# Patient Record
Sex: Male | Born: 1975 | Race: White | Hispanic: No | Marital: Single | Smoking: Current every day smoker
Health system: Southern US, Community
[De-identification: ages and names within clinical notes are randomized; demographics above are authoritative.]

## PROBLEM LIST (undated history)

## (undated) DIAGNOSIS — E7800 Pure hypercholesterolemia, unspecified: Secondary | ICD-10-CM

## (undated) DIAGNOSIS — M1990 Unspecified osteoarthritis, unspecified site: Secondary | ICD-10-CM

## (undated) DIAGNOSIS — I10 Essential (primary) hypertension: Secondary | ICD-10-CM

## (undated) HISTORY — PX: CARPAL TUNNEL RELEASE: SHX101

## (undated) HISTORY — PX: TESTICLE SURGERY: SHX794

## (undated) HISTORY — PX: EYE SURGERY: SHX253

## (undated) HISTORY — PX: CHOLECYSTECTOMY: SHX55

---

## 1999-05-21 ENCOUNTER — Encounter: Payer: Self-pay | Admitting: Family Medicine

## 1999-05-21 ENCOUNTER — Ambulatory Visit (HOSPITAL_COMMUNITY): Admission: RE | Admit: 1999-05-21 | Discharge: 1999-05-21 | Payer: Self-pay | Admitting: Family Medicine

## 1999-07-04 ENCOUNTER — Emergency Department (HOSPITAL_COMMUNITY): Admission: EM | Admit: 1999-07-04 | Discharge: 1999-07-04 | Payer: Self-pay | Admitting: Emergency Medicine

## 2002-03-21 ENCOUNTER — Emergency Department (HOSPITAL_COMMUNITY): Admission: EM | Admit: 2002-03-21 | Discharge: 2002-03-21 | Payer: Self-pay | Admitting: Emergency Medicine

## 2002-03-25 ENCOUNTER — Emergency Department (HOSPITAL_COMMUNITY): Admission: EM | Admit: 2002-03-25 | Discharge: 2002-03-25 | Payer: Self-pay | Admitting: Emergency Medicine

## 2002-11-06 ENCOUNTER — Encounter: Payer: Self-pay | Admitting: Internal Medicine

## 2002-11-06 ENCOUNTER — Encounter: Admission: RE | Admit: 2002-11-06 | Discharge: 2002-11-06 | Payer: Self-pay | Admitting: Internal Medicine

## 2006-02-10 ENCOUNTER — Ambulatory Visit (HOSPITAL_BASED_OUTPATIENT_CLINIC_OR_DEPARTMENT_OTHER): Admission: RE | Admit: 2006-02-10 | Discharge: 2006-02-10 | Payer: Self-pay | Admitting: Orthopedic Surgery

## 2006-03-08 ENCOUNTER — Ambulatory Visit (HOSPITAL_BASED_OUTPATIENT_CLINIC_OR_DEPARTMENT_OTHER): Admission: RE | Admit: 2006-03-08 | Discharge: 2006-03-08 | Payer: Self-pay | Admitting: Orthopedic Surgery

## 2006-08-19 ENCOUNTER — Emergency Department (HOSPITAL_COMMUNITY): Admission: EM | Admit: 2006-08-19 | Discharge: 2006-08-19 | Payer: Self-pay | Admitting: Emergency Medicine

## 2006-11-04 ENCOUNTER — Ambulatory Visit (HOSPITAL_COMMUNITY): Admission: RE | Admit: 2006-11-04 | Discharge: 2006-11-04 | Payer: Self-pay | Admitting: Internal Medicine

## 2006-12-01 ENCOUNTER — Encounter (INDEPENDENT_AMBULATORY_CARE_PROVIDER_SITE_OTHER): Payer: Self-pay | Admitting: Surgery

## 2006-12-01 ENCOUNTER — Ambulatory Visit (HOSPITAL_COMMUNITY): Admission: RE | Admit: 2006-12-01 | Discharge: 2006-12-01 | Payer: Self-pay | Admitting: Surgery

## 2007-04-04 ENCOUNTER — Emergency Department (HOSPITAL_COMMUNITY): Admission: EM | Admit: 2007-04-04 | Discharge: 2007-04-04 | Payer: Self-pay | Admitting: Emergency Medicine

## 2007-10-02 ENCOUNTER — Emergency Department (HOSPITAL_COMMUNITY): Admission: EM | Admit: 2007-10-02 | Discharge: 2007-10-02 | Payer: Self-pay | Admitting: Emergency Medicine

## 2008-12-24 IMAGING — US US ABDOMEN COMPLETE
1 series · 14 of 25 positions shown · non-contrast
Comparison: none

CLINICAL DATA: Right sided abdominal pain.  
 ABDOMEN ULTRASOUND:
TECHNIQUE: Complete abdominal ultrasound examination was performed including evaluation of the liver, gallbladder, bile ducts, pancreas, kidneys, spleen, IVC, and abdominal aorta.

[Series 1: us abdomen complete · 14 of 77 slices shown]
[im 1/77]
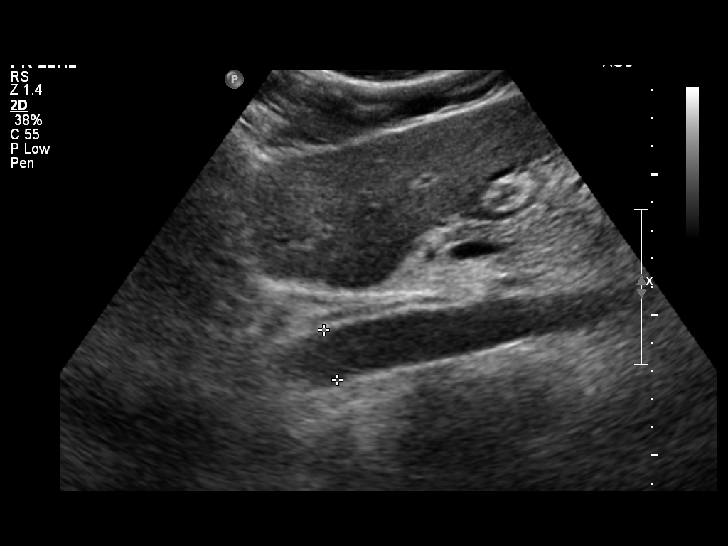
[im 7/77]
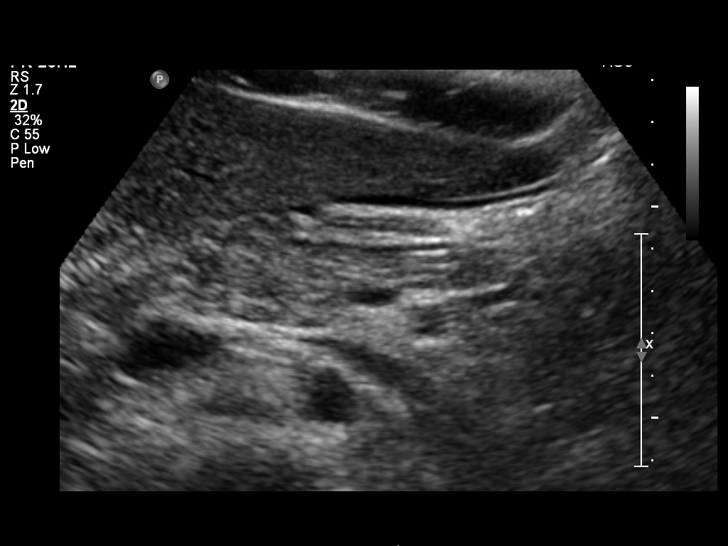
[im 13/77]
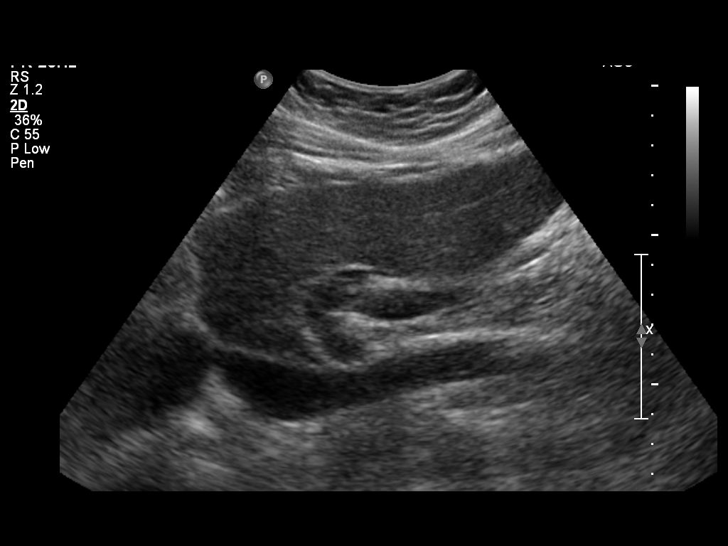
[im 20/77]
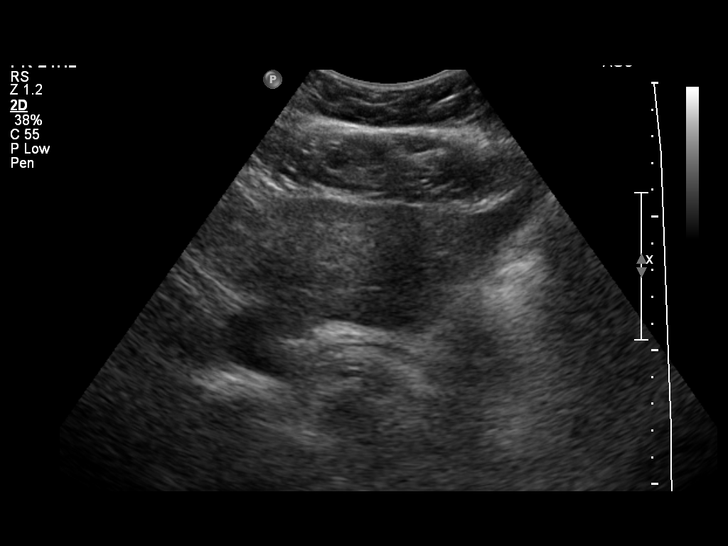
[im 26/77]
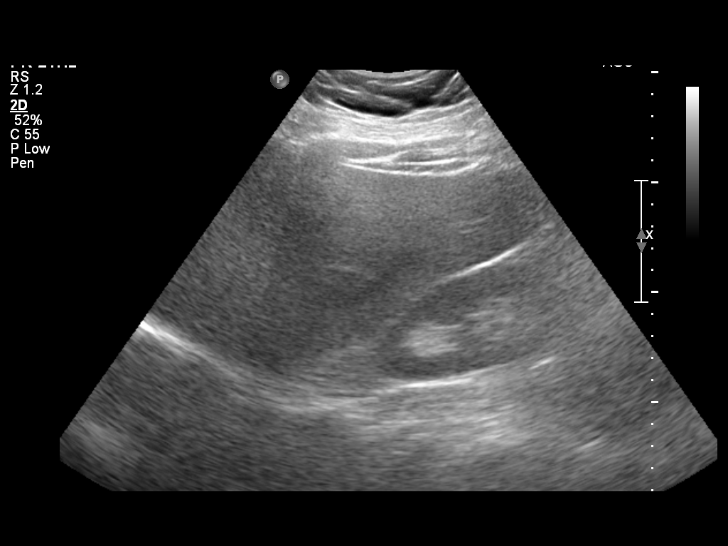
[im 29/77]
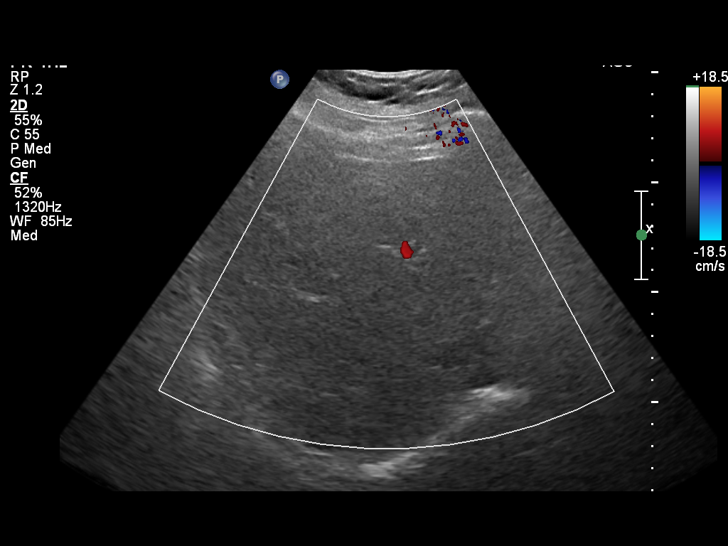
[im 35/77]
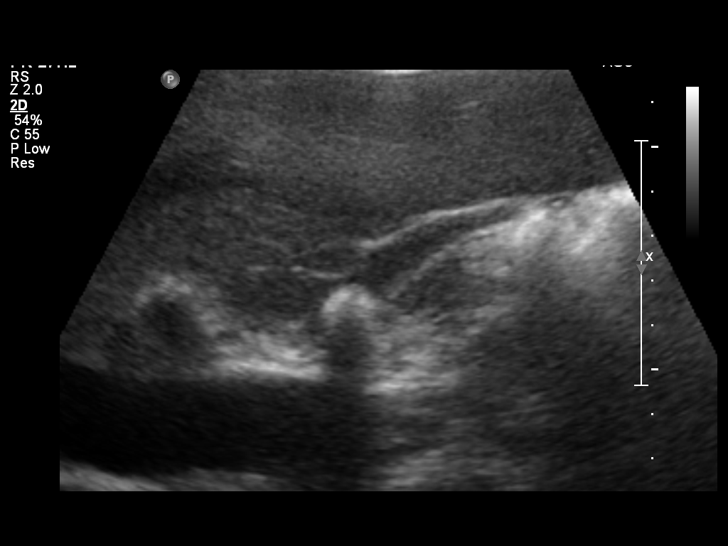
[im 42/77]
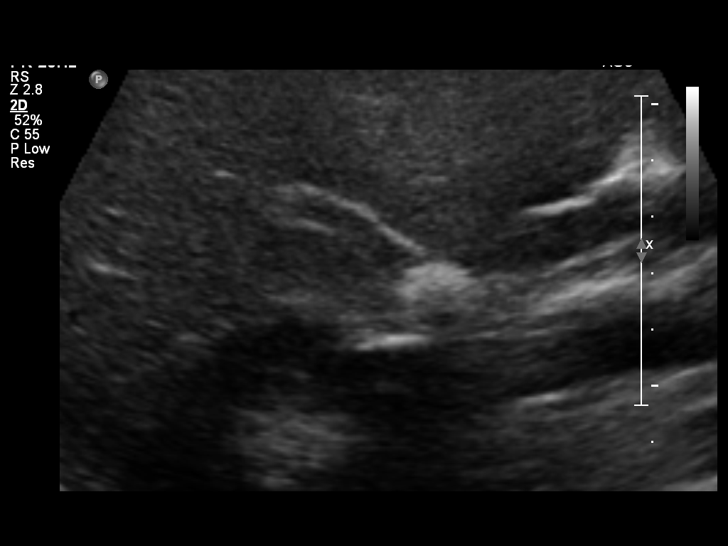
[im 48/77]
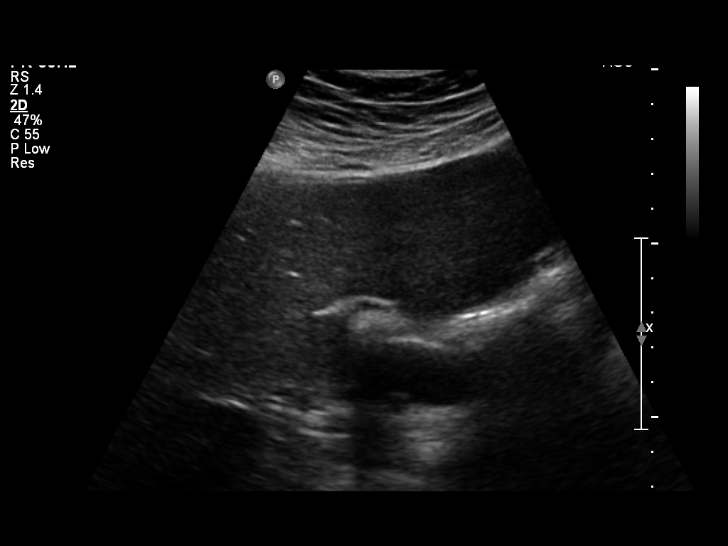
[im 51/77]
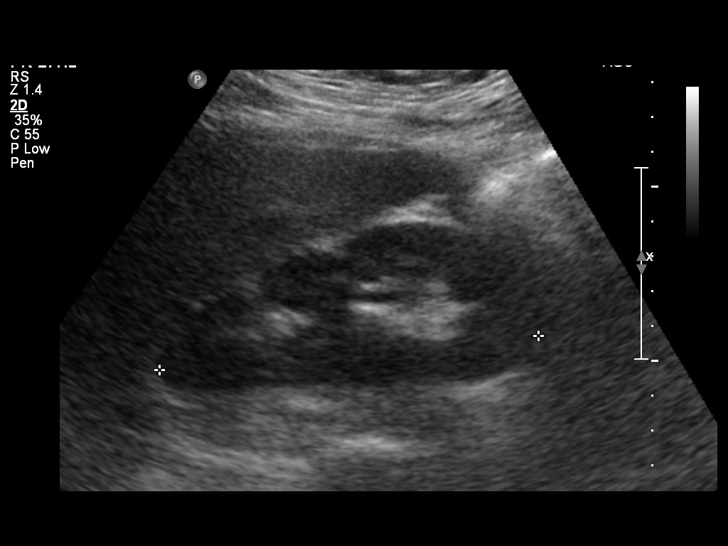
[im 58/77]
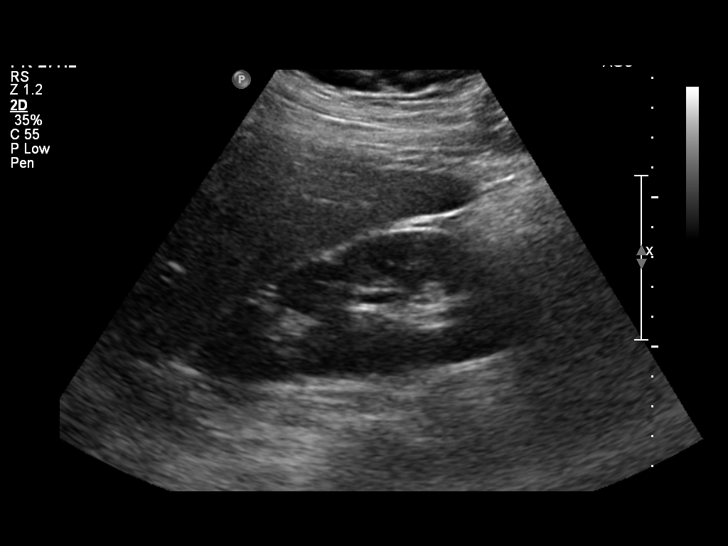
[im 64/77]
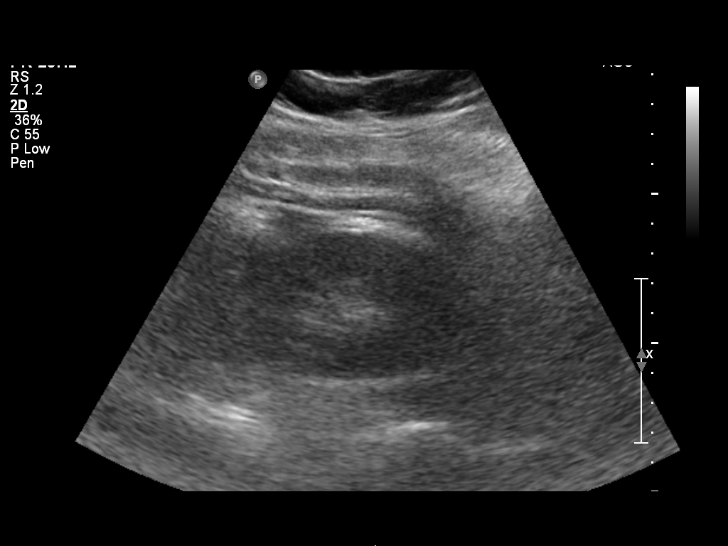
[im 70/77]
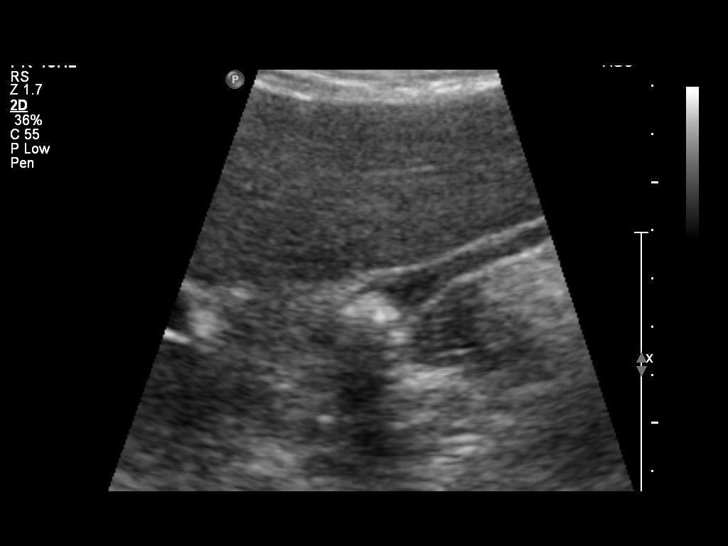
[im 77/77]
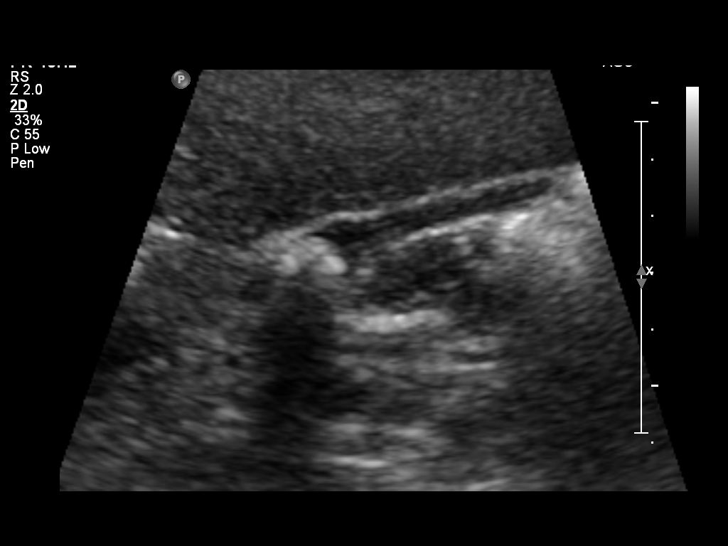

[14 of 25 positions shown; findings below may reference images not displayed]

FINDINGS: A 2 cm nonmobile gallstone is seen within the gallbladder neck.  The gallbladder is incompletely distended which somewhat limits evaluation, but no other gallstones are seen.  There is no evidence of gallbladder dilatation or wall thickening.  There is no evidence of biliary ductal dilatation with common bile duct measuring 2 mm.  Liver is within normal limits and echogenicity and no focal liver lesions are seen. 
 Visualized portions of the IVC and pancreas are unremarkable.  There is no evidence of splenomegaly.  Both kidneys are normal in size and appearance.  There is no evidence of hydronephrosis.  Abdominal aorta is nondilated.
IMPRESSION: 1.  2 cm gallstone within gallbladder neck.  No other sonographic signs of acute cholecystitis.  
 2.  No evidence of biliary ductal dilatation.

## 2010-11-03 NOTE — Op Note (Signed)
NAME:  [name], [name] NO.:  [id number]   MEDICAL RECORD NO.:  [id number]          PATIENT TYPE:  AMB   LOCATION:  DAY                          FACILITY:  [facility]   PHYSICIAN:  Ardeth Sportsman, MD     DATE OF BIRTH:  10-10-1975   DATE OF PROCEDURE:  12/01/2006  DATE OF DISCHARGE:                               OPERATIVE REPORT   PRIMARY CARE PHYSICIAN:  Lucky Cowboy, M.D.   SURGEON:  Ardeth Sportsman, MD   ASSISTANTS:  None.   PREOPERATIVE DIAGNOSES:  1. Cholecystolithiasis.  2. Probable chronic cholecystitis.   POSTOPERATIVE DIAGNOSES:  1. Cholecystolithiasis.  2. Probable chronic cholecystitis.  3. Fatty steatohepatosis of the liver.  4. Right posterolateral diaphragmatic eventration with liver; no      evidence of any significant herniation.   PROCEDURE PERFORMED:  1. Diagnostic laparoscopy.  2. Laparoscopic cholecystectomy  3. Laparoscopic intraoperative cholangiogram.   ANESTHESIA:  1. General anesthesia.  2. Local anesthetic in a field block port sites.   SPECIMEN:  Gallbladder.   DRAINS:  None.  Hyperbilirubinemia  Less than 5 mL.   COMPLICATIONS:  None apparent.   INDICATIONS:  Mr. Ion is a pleasant 35 year old male with a classic  story for biliary colic, as well as possible reflux-like symptoms.  He  has undergone extensive workup by his primary care physician, Dr.  Oneta Rack, and had evidence of a large gallstone in the gallbladder neck.  He has had some improvement of his symptoms on proton pump inhibitors,  but he has not completely abated; and he still has persistent biliary  colic symptoms.   The anatomy and physiology of hepatobiliary and pancreatic function as  well as the antireflux mechanism and foregut physiology was discussed in  detail.  Pathophysiology of cholecystolithiasis with its risk of  gallstone pancreatitis, cholecystitis with severe dehydration,  choledocholithiasis, and other natural history risks were  discussed.  Options were discussed and recommendations made for laparoscopic  cholecystectomy with intraoperative cholangiogram.   Risks such as stroke, heart attack, deep venous thrombosis, pulmonary  embolism, and death were discussed.  Risks such as bleeding, need for  transfusion, wound infection, abscess, injury to other organs,  incisional hernia, prolonged pain and persistent reflux and other risks  were discussed.  The risks of bile leak including the need for operative  reconstruction or bile duct exploration; internal-or-external drainage  with stenting and/or drains were discussed as well.  Other risks were  discussed.  Questions answered, he agreed to proceed.   OPERATIVE FINDINGS:  He had a shrunken gallbladder with an obvious large  stone within it and a somewhat thickened wall.  There were no major  adhesions.  His liver was very fatty, but I do not think that he had  raging hepatitis or evidence of any cirrhosis.  He had a posterolateral  diaphragmatic eventration which had his right hepatic liver going up  into it with no other evidence of any incarceration.  It seemed to be  probably about, I would say, about 10 x 10 cm in size.   DESCRIPTION OF PROCEDURE:  Informed  consent was confirmed.  The patient  received IV antibiotics.  He urinated prior to going to the operating  room.  He had sequential compression devices active during the entire  case.  He underwent general anesthesia without difficulty.  His abdomen  was scrubbed, prepped, and draped in a sterile fashion.   Entry was gained into the abdomen with the patient in steep reverse  Trendelenburg and right side up using optical entry using a 5-mm/0-  degree scope.  Camera inspection revealed a small nick on the liver, but  no evidence of any significant laceration.  There was no other evidence  of injury.  Under direct visualization, a 5-mm port was placed through  the umbilicus; and another one in the right  flank.  A 10-mm port was  tunneled through the falciform ligament; in the right subxiphoid region;  just right of midline.   The gallbladder fundus was grasped and elevated cephalad.  Peritoneal  coverage between the gallbladder and liver bed were freed off in the  anteroposterior and posterolateral aspects.  During this, there was a  nick in the gallbladder; and there was some leak of uninfected bile.  This was aspirated and caught well to minimize contamination.  There was  no spillage of stones.  His liver falciform ligament was very long and  blocking in the way, since it seemed to be far right of midline.  I  ultimately ended up ligating and transecting his falciform ligament,  using a focused high-dose cautery; and reinspection showed good  hemostasis at the end of the case.   Circumferential dissection was done such that the proximal half of the  gallbladder was freed off the liver bed.  This revealed three structures  going from the gallbladder down to the porta hepatis.  Two were small  branches of the anterior and posterior branches of the cystic artery  since they came down in a Y to go down to the porta hepatis.  One clip  high on the  gallbladder side, and two clips slightly proximally were  done; and these two structures were transected.  This left one structure  coming, tapering off the infundibulum, down to the porta hepatis  consistent with the cystic duct.  Two clips on the infundibulum were  placed and a partial cyst dichotomy was performed.  I got immediate  return of clear bile.   A 5-French cholangiocatheter was placed through a right subcostal stab  incision, flush, and placed in the cystic duct.  Cholangiogram was run  using a diluted radiopaque opaque contrast using continuous fluoroscopy.  Cholangiogram was normal in that there was cannulation of a side  helicobranch into a common bile duct.  Contrast flowed well into the  right and left intrahepatic chains.   It flowed down over a nice smooth  tapering across the ampule into a normal appearing duodenum.  Cholangiocath was removed.  Four clips were placed on the cystic duct  slightly proximal to the partial transection and cystic duct transection  was completed.   Gallbladder was were freed from its remaining attachments on the liver  bed and removed in an EndoCatch bag out the subxiphoid port to a good  result.  Camera inspection was done to have excellent hemostasis on the  liver bed; at the site of the former falciform ligament to make sure  that there is no evidence of any injury or laceration.  It was  excellent.  Clips were intact on the cystic arterial  branches; and the  cystic duct stumps.  Copious irrigation of 2 liters were done with a  nice clear return with no evidence of any significant bile or blood  contamination.  The fascial defect of the subxiphoid port was  reapproximated using a #0 Vicryl stitch using laparoscopic #0 passer.  The upper three abdominal ports were removed; and there was no evidence  of any bleeding or peritoneum or hematoma.  Peritoneum was evacuated and  all ports were removed.  Fascial stitch was tied down.  Skin was closed  using a 4-0 Monocryl stitch and sterile dressing was applied.  The  patient was extubated and sent to the recovery room in stable condition.   I explained the operative findings to the patient's family, questions  answered, and they expressed understanding and appreciation.      Ardeth Sportsman, MD  Electronically Signed     SCG/MEDQ  D:  12/01/2006  T:  12/01/2006  Job:  [id number]   cc:   Lucky Cowboy, M.D.  Fax: [phone]

## 2010-11-06 NOTE — Op Note (Signed)
NAME:  [name], [name] NO.:  [id number]   MEDICAL RECORD NO.:  [id number]          PATIENT TYPE:  AMB   LOCATION:  [facility]                         FACILITY:  [facility]   PHYSICIAN:  Deidre Ala, M.D.    DATE OF BIRTH:  1976-02-05   DATE OF PROCEDURE:  02/10/2006  DATE OF DISCHARGE:                                 OPERATIVE REPORT   PREOPERATIVE DIAGNOSIS:  Severe right carpal tunnel syndrome.   POSTOPERATIVE DIAGNOSIS:  Severe right carpal tunnel syndrome.   PROCEDURE:  Right carpal tunnel release.   SURGEON:  1. Charlesetta Shanks, M.D.   ASSISTANT:  Clarene Reamer, P.A.-C.   ANESTHESIA:  IV regional.   CULTURES:  None.   DRAINS:  None.   ESTIMATED BLOOD LOSS:  Minimal.   TOURNIQUET TIME:  30 minutes.   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  He had signs and symptoms of  carpal tunnel syndrome.  He works for [employer], working at the [employer].  He underwent nerve conduction studies which showed severe  right side median mononeuropathy and moderate left. We did do injections  with splints, but due to persistence of his symptoms and increasing  discomfort he elected to proceed with release.  At surgery, he had a very  tense carpal canal, with the nerve being pressed up against the transverse  carpal ligament.  The nerve was somewhat enlarged over the normal size and  bifurcated at the level of the transverse carpal ligament with hourglassing.  There was tenosynovitis of the wrist, but there were no overt palpable  masses.  Good release was obtained well up to the distal forearm, with a  relatively small palmar incision.  All branches were placed distally,  including the motor branch.   PROCEDURE:  With adequate anesthesia obtained using IV regional technique, 1  g Ancef given for IV prophylaxis, the patient was placed in the supine  position.  The right upper  extremity was prepped from fingertips to the  upper forearm in the standard fashion.  After standard prepping and draping,  a curvilinear incision was made at the base of the palm in line with the  thenar flexion crease to the distal wrist flexion crease.  Incision was  deepened sharply with a knife, and hemostasis obtained using the bipolar  coagulator.  Loupe magnification was used as dissection was carried down the  palmar fascia.  A Freer elevator was then placed underneath the transverse  carpal ligament to protect the nerve, and we cut down upon it with a 64  Beaver blade, exposing the nerve.  There was some perineural fat that was  also expanding in space.  I then meticulously dissected with scissors and  neurolysed the nerve, all branches distal, including the motor branch and  proximal, and released the distal wrist retinaculum on the ulnar side of the  nerve up the distal forearm underneath the skin, with a Sewell retractor  used for visualization.  I then carefully dissected the  volar component of  the epineurium to open up the hourglassing of the nerve.  I then checked the  underlying carpal tunnel and palpated with the small finger up the canal to  make sure there were no overt masses.  Irrigation was carried out. Bleeding  points were cauterized.  The wound was then closed using running and  interrupted 4-0 nylon on the skin.  I then had placed due to some increased  pain during surgery after IV regional, about 10 cc 0.5% Marcaine plain in  and about the wound.  A bulky sterile compressive dressings was applied with  volar splint, plaster splint in slight cock-up. The patient then, having  tolerated the procedure well, was awakened and taken to the recovery room in  satisfactory condition, to be discharged per outpatient routine, given  Percocet for pain, elevation, and told call the office for recheck on  Monday.           ______________________________  V. Charlesetta Shanks,  M.D.     VEP/MEDQ  D:  02/10/2006  T:  02/10/2006  Job:  [id number]

## 2010-11-06 NOTE — Op Note (Signed)
NAME:  WHEELER, INCORVAIA NO.:  [id number]   MEDICAL RECORD NO.:  [id number]          PATIENT TYPE:  AMB   LOCATION:  [facility]                         FACILITY:  [facility]   PHYSICIAN:  Deidre Ala, M.D.    DATE OF BIRTH:  October 15, 1975   DATE OF PROCEDURE:  03/08/2006  DATE OF DISCHARGE:                                 OPERATIVE REPORT   PREOPERATIVE DIAGNOSIS:  Left carpal tunnel syndrome, moderate to severe.   POSTOPERATIVE DIAGNOSIS:  Left carpal tunnel syndrome, moderate to severe.   PROCEDURE:  Left carpal tunnel release.   SURGEON:  1. Charlesetta Shanks, M.D.   ASSISTANT:  Clarene Reamer, P.A.-C.   ANESTHESIA:  IV regional.   CULTURES:  None.   DRAINS:  None.   BLOOD LOSS:  Minimal.   TOURNIQUET TIME:  30 minutes.   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  He works as a Radiographer, therapeutic, working  at [employer].  He underwent nerve conduction studies which  showed severe right side median mononeuropathy and moderate left.  We did do  injections with splints, but due to persistence of his symptoms, we went  ahead with a right carpal tunnel release on February 10, 2006 at this  facility.  He had excellent results in the short-term and is doing well and  decided to proceed with the other side.  At surgery, he had a very tight  nerve under tight transverse carpal ligament that released well.  All  branches were traced distally including the motor branch.   PROCEDURE:  With adequate anesthesia obtained using IV regional technique, 1  gram of Ancef given IV prophylaxis.  The patient was placed in the supine  position.  The left upper extremity was prepped from the fingers to the  upper forearm in the standard fashion.  After standard prepping and draping,  an incision was then made in the Langer skin lines in the palmar base  longitudinally to the distal palmar flexion crease.   Incision was deepened  sharply, and adequate hemostasis was obtained using the Bovie  electrocoagulator.  Under loop magnification, dissection was carried down to  the transverse carpal ligament as well as palmar fascia.  A Freer elevator  was placed underneath it to protect the nerve, and then I cut down upon with  a 64 Beaver blade to release it.  The nerve was then neurolysed with  scissors and released on the ulnar border of the distal wrist flexor  retinaculum with a Sewell retractor.  All branches were traced distally  including the motor branch.  We did a volar aponeurotomy.  When I was  satisfied with release and having removed part of the ulnar side of the  transverse carpal ligament, irrigation was carried out.  The wound was then  closed with running and interrupted 4-0 nylon.  A bulky sterile compressive  dressing was applied with volar plaster splint in slight cock-up.  The  patient having tolerated procedure well was taken to the  recovery room in  satisfactory condition to be discharged per outpatient routine, given  Vicodin for pain and told to call the office for an appointment for recheck  on Friday.           ______________________________  V. Charlesetta Shanks, M.D.     VEP/MEDQ  D:  03/08/2006  T:  03/09/2006  Job:  [id number]

## 2011-04-08 LAB — BASIC METABOLIC PANEL
BUN: 14
CO2: 28
Chloride: 105
GFR calc non Af Amer: >60
Glucose, Bld: 101 — ABNORMAL HIGH
Potassium: 4
Sodium: 142

## 2016-08-30 ENCOUNTER — Encounter (HOSPITAL_COMMUNITY): Payer: Self-pay | Admitting: Emergency Medicine

## 2016-08-30 ENCOUNTER — Emergency Department (HOSPITAL_COMMUNITY): Payer: Self-pay

## 2016-08-30 ENCOUNTER — Emergency Department (HOSPITAL_COMMUNITY)
Admission: EM | Admit: 2016-08-30 | Discharge: 2016-08-30 | Disposition: A | Discharge status: Home / Self Care | Payer: Self-pay | Attending: Emergency Medicine | Admitting: Emergency Medicine

## 2016-08-30 DIAGNOSIS — F17200 Nicotine dependence, unspecified, uncomplicated: Secondary | ICD-10-CM | POA: Insufficient documentation

## 2016-08-30 DIAGNOSIS — J029 Acute pharyngitis, unspecified: Secondary | ICD-10-CM | POA: Insufficient documentation

## 2016-08-30 DIAGNOSIS — J069 Acute upper respiratory infection, unspecified: Secondary | ICD-10-CM

## 2016-08-30 DIAGNOSIS — R059 Cough, unspecified: Secondary | ICD-10-CM

## 2016-08-30 DIAGNOSIS — I10 Essential (primary) hypertension: Secondary | ICD-10-CM | POA: Insufficient documentation

## 2016-08-30 DIAGNOSIS — R05 Cough: Secondary | ICD-10-CM

## 2016-08-30 HISTORY — DX: Pure hypercholesterolemia, unspecified: E78.00

## 2016-08-30 HISTORY — DX: Unspecified osteoarthritis, unspecified site: M19.90

## 2016-08-30 HISTORY — DX: Essential (primary) hypertension: I10

## 2016-08-30 LAB — BASIC METABOLIC PANEL
ANION GAP: 11 (ref 5–15)
BUN: 13 mg/dL (ref 6–20)
CHLORIDE: 97 mmol/L — AB (ref 101–111)
CO2: 28 mmol/L (ref 22–32)
Calcium: 9.4 mg/dL (ref 8.9–10.3)
Creatinine, Ser: 0.88 mg/dL (ref 0.61–1.24)
GFR calc Af Amer: >60 mL/min (ref >60)
GFR calc non Af Amer: >60 mL/min (ref >60)
GLUCOSE: 112 mg/dL — AB (ref 65–99)
POTASSIUM: 3.6 mmol/L (ref 3.5–5.1)
SODIUM: 136 mmol/L (ref 135–145)

## 2016-08-30 LAB — CBC
HEMATOCRIT: 45.3 % (ref 39.0–52.0)
HEMOGLOBIN: 14.9 g/dL (ref 13.0–17.0)
MCH: 29.2 pg (ref 26.0–34.0)
MCHC: 32.9 g/dL (ref 30.0–36.0)
MCV: 88.8 fL (ref 78.0–100.0)
Platelets: 223 10*3/uL (ref 150–400)
RBC: 5.1 MIL/uL (ref 4.22–5.81)
RDW: 14.3 % (ref 11.5–15.5)
WBC: 9.6 10*3/uL (ref 4.0–10.5)

## 2016-08-30 LAB — I-STAT TROPONIN, ED: Troponin i, poc: 0 ng/mL (ref 0.00–0.08)

## 2016-08-30 MED ORDER — PREDNISONE 20 MG PO TABS: 40.0000 mg | ORAL_TABLET | Freq: Every day | ORAL | 0 refills | Status: AC

## 2016-08-30 MED ORDER — ALBUTEROL SULFATE HFA 108 (90 BASE) MCG/ACT IN AERS: 1.0000 | INHALATION_SPRAY | Freq: Four times a day (QID) | RESPIRATORY_TRACT | 0 refills | Status: DC | PRN

## 2016-08-30 MED ORDER — AZITHROMYCIN 250 MG PO TABS: 250.0000 mg | ORAL_TABLET | Freq: Every day | ORAL | 0 refills | Status: DC

## 2016-08-30 NOTE — ED Provider Notes (Signed)
Emergency Department Provider Note   I have reviewed the triage vital signs and the nursing notes.   HISTORY  Chief Complaint Chest Pain and Cough   HPI [name] is a 40 y.o. male with PMH of tobacco use, HLD, and HTN presents to the emergency department for evaluation of 5 days of cough, headache, sore throat. Patient states that people in his family have similar symptoms. They're mild to moderate. He is been using his albuterol nebulizer which he had prescribed during a recent episode of bronchitis which caused him to cough more but seemed to help. His continue to smoke cigarettes. Denies fever. He has chest pain only with coughing. He is eating and drinking normally. He says he tried to schedule with his primary care physician but was unable to do so. Symptoms seem to be gradually worsening. Symptoms slightly worse at night. No significant modifying factors.  Patient says he is also had some lower extremity swelling. He's had this for several months. He took another family members Lasix and feels that it's improved. He does have an appointment scheduled in the next 2 weeks to discuss this with his primary care physician.   Past Medical History:  Diagnosis Date  . Arthritis   . High cholesterol   . Hypertension     There are no active problems to display for this patient.   Past Surgical History:  Procedure Laterality Date  . CARPAL TUNNEL RELEASE    . CHOLECYSTECTOMY    . EYE SURGERY    . TESTICLE SURGERY        Allergies Patient has no known allergies.  No family history on file.  Social History Social History  Substance Use Topics  . Smoking status: Current Some Day Smoker    Packs/day: 0.50  . Smokeless tobacco: Not on file  . Alcohol use No    Review of Systems  10-point ROS otherwise negative.  ____________________________________________   PHYSICAL EXAM:  VITAL SIGNS: ED Triage Vitals  Enc Vitals Group     BP 08/30/16 0953 118/61     Pulse Rate 08/30/16 0953 89     Resp 08/30/16 0953 20     Temp 08/30/16 0953 97.8 F (36.6 C)     Temp Source 08/30/16 0953 Oral     SpO2 08/30/16 0953 97 %     Pain Score 08/30/16 0950 0   Constitutional: Alert and oriented. Well appearing and in no acute distress. Eyes: Conjunctivae are normal.  Head: Atraumatic. Nose: No congestion/rhinnorhea. Mouth/Throat: Mucous membranes are moist.  Oropharynx with mild erythema. No exudate or PTA.  Neck: No stridor.   Cardiovascular: Normal rate, regular rhythm. Good peripheral circulation. Grossly normal heart sounds.   Respiratory: Normal respiratory effort.  No retractions. Lungs with faint expiratory wheezing bilaterally.  Gastrointestinal: Soft and nontender. No distention.  Musculoskeletal: No lower extremity tenderness. Trace to 1+ pitting edema in the LE bilaterally. No gross deformities of extremities. Neurologic:  Normal speech and language. No gross focal neurologic deficits are appreciated.  Skin:  Skin is warm, dry and intact. No rash noted. Psychiatric: Mood and affect are normal. Speech and behavior are normal.  ____________________________________________   LABS (all labs ordered are listed, but only abnormal results are displayed)  Labs Reviewed  BASIC METABOLIC PANEL - Abnormal; Notable for the following:       Result Value   Chloride 97 (*)    Glucose, Bld 112 (*)    All other components within normal  limits  CBC  I-STAT TROPOININ, ED   ____________________________________________  EKG   EKG Interpretation  Date/Time:  Monday August 30 2016 09:48:08 EDT Ventricular Rate:  81 PR Interval:  160 QRS Duration: 116 QT Interval:  368 QTC Calculation: 427 R Axis:   84 Text Interpretation:  Normal sinus rhythm Incomplete right bundle branch block ST & T wave abnormality, consider inferior ischemia Abnormal ECG No STEMI.  Confirmed by Tomara Youngberg MD, Moris Ratchford [phone]([id number]) on 08/30/2016 12:14:44 PM        ____________________________________________  RADIOLOGY  Dg Chest 2 View  Result Date: 08/30/2016 CLINICAL DATA:  Generalized chest pain with productive cough for the past 5 days. Current smoker. EXAM: CHEST  2 VIEW COMPARISON:  Chest x-ray of July 30, 2009 FINDINGS: The lungs are adequately inflated. There is no focal infiltrate the interstitial markings are coarse. There is no pleural effusion. The heart and pulmonary vascularity are normal. The mediastinum is normal in width. The trachea is midline. The bony thorax exhibits no acute abnormality. IMPRESSION: No acute pneumonia nor CHF.  Mild chronic bronchitic changes. Electronically Signed   By: David  SwazilandJordan M.D.   On: 08/30/2016 10:18    ____________________________________________   PROCEDURES  Procedure(s) performed:   Procedures  None ____________________________________________   INITIAL IMPRESSION / ASSESSMENT AND PLAN / ED COURSE  Pertinent labs & imaging results that were available during my care of the patient were reviewed by me and considered in my medical decision making (see chart for details).  Patient resents to the emergency department for evaluation of dyspnea, cough, headache, sore throat. Symptoms are mild and seem most consistent with bronchitis. Patient has lungs with faint expiratory wheezing bilaterally. No wrist or distress. No hypoxemia. Chest x-ray shows no fluid or infiltrate. Labs including troponin are normal. Patient has had 5 days of constant symptoms. Symptoms are most consistent with bronchitis. Plan for discharge home with steroid, Z-Pak, albuterol inhaler.  I specifically counseled the patient on smoking cessation. He is considering quitting and actively working with his family to do so.  At this time, I do not feel there is any life-threatening condition present. I have reviewed and discussed all results (EKG, imaging, lab, urine as appropriate), exam findings with patient. I have  reviewed nursing notes and appropriate previous records.  I feel the patient is safe to be discharged home without further emergent workup. Discussed usual and customary return precautions. Patient and family (if present) verbalize understanding and are comfortable with this plan.  Patient will follow-up with their primary care provider. If they do not have a primary care provider, information for follow-up has been provided to them. All questions have been answered.  ____________________________________________  FINAL CLINICAL IMPRESSION(S) / ED DIAGNOSES  Final diagnoses:  Cough  Sore throat  Viral URI     MEDICATIONS GIVEN DURING THIS VISIT:  Medications - No data to display   NEW OUTPATIENT MEDICATIONS STARTED DURING THIS VISIT:  Discharge Medication List as of 08/30/2016 12:19 PM    START taking these medications   Details  albuterol (PROVENTIL HFA;VENTOLIN HFA) 108 (90 Base) MCG/ACT inhaler Inhale 1-2 puffs into the lungs every 6 (six) hours as needed for wheezing or shortness of breath., Starting Mon 08/30/2016, Print    azithromycin (ZITHROMAX) 250 MG tablet Take 1 tablet (250 mg total) by mouth daily. Take first 2 tablets together, then 1 every day until finished., Starting Mon 08/30/2016, Print    predniSONE (DELTASONE) 20 MG tablet Take 2 tablets (40 mg total)  by mouth daily., Starting Mon 08/30/2016, Until Sat 09/04/2016, Print          Note:  This document was prepared using Dragon voice recognition software and may include unintentional dictation errors.  Alona Bene, MD Emergency Medicine   Maia Plan, MD 08/30/16 [phone]

## 2016-08-30 NOTE — ED Triage Notes (Addendum)
Pt reports generalized chest pain and productive cough since last Wednesday. Reports coughing up white mucus, pts forehead diaphoretic in triage.

## 2016-08-30 NOTE — ED Notes (Signed)
Pt states he has been taking "over the counter fluid pills" because he is worried is getting fluid on his legs bc his family has a hx of this. RN informed him there aren't fluid pills that are over the counter- pt states he has been taking a friend's furosemide every few days. RN encouraged him to talk to his MD about his fear of fluid on his legs and taking the lasix. Pt agreeable. Upon assessment, no pitting edema noted. Pt states he does not feel like he has it today either.

## 2016-08-30 NOTE — Discharge Instructions (Signed)

## 2018-08-14 ENCOUNTER — Encounter (HOSPITAL_COMMUNITY): Payer: Self-pay | Admitting: Emergency Medicine

## 2018-08-14 ENCOUNTER — Emergency Department (HOSPITAL_COMMUNITY): Payer: Self-pay

## 2018-08-14 ENCOUNTER — Emergency Department (HOSPITAL_COMMUNITY)
Admission: EM | Admit: 2018-08-14 | Discharge: 2018-08-14 | Disposition: A | Discharge status: Home / Self Care | Payer: Self-pay | Attending: Emergency Medicine | Admitting: Emergency Medicine

## 2018-08-14 ENCOUNTER — Emergency Department (HOSPITAL_BASED_OUTPATIENT_CLINIC_OR_DEPARTMENT_OTHER): Payer: Self-pay

## 2018-08-14 ENCOUNTER — Other Ambulatory Visit: Payer: Self-pay

## 2018-08-14 DIAGNOSIS — Z79899 Other long term (current) drug therapy: Secondary | ICD-10-CM | POA: Insufficient documentation

## 2018-08-14 DIAGNOSIS — M7989 Other specified soft tissue disorders: Secondary | ICD-10-CM

## 2018-08-14 DIAGNOSIS — R609 Edema, unspecified: Secondary | ICD-10-CM | POA: Insufficient documentation

## 2018-08-14 DIAGNOSIS — I10 Essential (primary) hypertension: Secondary | ICD-10-CM | POA: Insufficient documentation

## 2018-08-14 DIAGNOSIS — M79675 Pain in left toe(s): Secondary | ICD-10-CM | POA: Insufficient documentation

## 2018-08-14 DIAGNOSIS — F17210 Nicotine dependence, cigarettes, uncomplicated: Secondary | ICD-10-CM | POA: Insufficient documentation

## 2018-08-14 LAB — CBC
HEMATOCRIT: 45.1 % (ref 39.0–52.0)
Hemoglobin: 14 g/dL (ref 13.0–17.0)
MCH: 28.6 pg (ref 26.0–34.0)
MCHC: 31 g/dL (ref 30.0–36.0)
MCV: 92.2 fL (ref 80.0–100.0)
NRBC: 0 % (ref 0.0–0.2)
Platelets: 209 10*3/uL (ref 150–400)
RBC: 4.89 MIL/uL (ref 4.22–5.81)
RDW: 14 % (ref 11.5–15.5)
WBC: 6.9 10*3/uL (ref 4.0–10.5)

## 2018-08-14 LAB — I-STAT TROPONIN, ED: Troponin i, poc: 0 ng/mL (ref 0.00–0.08)

## 2018-08-14 LAB — BASIC METABOLIC PANEL
Anion gap: 8 (ref 5–15)
BUN: 15 mg/dL (ref 6–20)
CHLORIDE: 105 mmol/L (ref 98–111)
CO2: 26 mmol/L (ref 22–32)
CREATININE: 0.91 mg/dL (ref 0.61–1.24)
Calcium: 8.8 mg/dL — ABNORMAL LOW (ref 8.9–10.3)
GFR calc Af Amer: >60 mL/min (ref >60)
GFR calc non Af Amer: >60 mL/min (ref >60)
GLUCOSE: 134 mg/dL — AB (ref 70–99)
Potassium: 3.6 mmol/L (ref 3.5–5.1)
Sodium: 139 mmol/L (ref 135–145)

## 2018-08-14 LAB — BRAIN NATRIURETIC PEPTIDE: B Natriuretic Peptide: 57.8 pg/mL (ref 0.0–100.0)

## 2018-08-14 MED ORDER — SODIUM CHLORIDE 0.9% FLUSH: 3.0000 mL | Freq: Once | INTRAVENOUS | Status: DC

## 2018-08-14 NOTE — Progress Notes (Signed)
Left lower extremity venous duplex completed. Refer to "CV Proc" under chart review to view preliminary results.  08/14/2018 4:37 PM Gertie Fey, MHA, RVT, RDCS, RDMS

## 2018-08-14 NOTE — ED Triage Notes (Signed)
Onset 2 days developed left greater toe and foot. Pain increased with toe purple in color. States also has shortness of breath with increased edema bilateral lower extremities. Denies chest pain states has productive cough yellow intermittent.

## 2018-08-14 NOTE — ED Provider Notes (Signed)
[facility] EMERGENCY DEPARTMENT Provider Note   CSN: [id number] Arrival date & time: 08/14/18  1151    History   Chief Complaint Chief Complaint  Patient presents with  . Shortness of Breath  . Leg Swelling  . Foot Pain    HPI [name] is a 43 y.o. male.      Foot Pain  This is a new problem. The current episode started 12 to 24 hours ago. The problem occurs constantly. The problem has not changed since onset.Pertinent negatives include no chest pain, no abdominal pain, no headaches and no shortness of breath. Nothing aggravates the symptoms. Nothing relieves the symptoms. He has tried nothing for the symptoms. The treatment provided no relief.    Past Medical History:  Diagnosis Date  . Arthritis   . High cholesterol   . Hypertension     There are no active problems to display for this patient.   Past Surgical History:  Procedure Laterality Date  . CARPAL TUNNEL RELEASE    . CHOLECYSTECTOMY    . EYE SURGERY    . TESTICLE SURGERY          Home Medications    Prior to Admission medications   Medication Sig Start Date End Date Taking? Authorizing Provider  albuterol (PROVENTIL HFA;VENTOLIN HFA) 108 (90 Base) MCG/ACT inhaler Inhale 1-2 puffs into the lungs every 6 (six) hours as needed for wheezing or shortness of breath. 08/30/16  Yes Long, Arlyss Repress, MD  atenolol (TENORMIN) 100 MG tablet Take 100 mg by mouth daily. 05/09/18  Yes [provider]  atorvastatin (LIPITOR) 20 MG tablet Take 20 mg by mouth daily.   Yes [provider]  hydrochlorothiazide (HYDRODIURIL) 25 MG tablet Take 25 mg by mouth daily. 07/13/18  Yes [provider]  lisinopril-hydrochlorothiazide (PRINZIDE,ZESTORETIC) 10-12.5 MG tablet Take 1 tablet by mouth daily.   Yes [provider]  sertraline (ZOLOFT) 100 MG tablet Take 200 mg by mouth daily.   Yes [provider]    Family History No family history on  file.  Social History Social History   Tobacco Use  . Smoking status: Current Some Day Smoker    Packs/day: 0.50  Substance Use Topics  . Alcohol use: No  . Drug use: No     Allergies   Patient has no known allergies.   Review of Systems Review of Systems  Constitutional: Negative for chills and fever.  HENT: Negative for ear pain and sore throat.   Eyes: Negative for pain and visual disturbance.  Respiratory: Negative for cough and shortness of breath.   Cardiovascular: Negative for chest pain and palpitations.  Gastrointestinal: Negative for abdominal pain and vomiting.  Genitourinary: Negative for dysuria and hematuria.  Musculoskeletal: Negative for arthralgias and back pain.       Worsening lower extremity edema  Skin: Negative for color change and rash.  Neurological: Negative for seizures, syncope and headaches.  All other systems reviewed and are negative.    Physical Exam Updated Vital Signs BP 103/64   Pulse (!) 59   Temp 98.6 F (37 C) (Oral)   Resp 18   Ht  (1.753 m)   Wt (!) 163.3 kg   SpO2 100%   BMI 53.16 kg/m   Physical Exam Vitals signs and nursing note reviewed.  Constitutional:      Appearance: He is well-developed.     Comments: Patient resting comfortably in the bed, no acute distress, nontachypneic.  HENT:  Head: Normocephalic and atraumatic.  Eyes:     Conjunctiva/sclera: Conjunctivae normal.  Neck:     Musculoskeletal: Neck supple.  Cardiovascular:     Rate and Rhythm: Normal rate and regular rhythm.     Heart sounds: No murmur.  Pulmonary:     Effort: Pulmonary effort is normal. No tachypnea or respiratory distress.     Breath sounds: Normal breath sounds. No decreased breath sounds, wheezing, rhonchi or rales.  Abdominal:     Palpations: Abdomen is soft.     Tenderness: There is no abdominal tenderness.  Musculoskeletal:     Right lower leg: Edema present.     Left lower leg: Edema present.     Comments:  Worsening baseline lower extremity edema, 2+ pitting up to the knee, venous stasis.  Patient has ecchymoses about the left big toe, tenderness to palpation.  Pulse, motor, sensory intact.  Likely musculoskeletal injury, strain.  Skin:    General: Skin is warm and dry.     Capillary Refill: Capillary refill takes less than 2 seconds.  Neurological:     General: No focal deficit present.     Mental Status: He is alert.  Psychiatric:        Mood and Affect: Mood normal.      ED Treatments / Results  Labs (all labs ordered are listed, but only abnormal results are displayed) Labs Reviewed  BASIC METABOLIC PANEL - Abnormal; Notable for the following components:      Result Value   Glucose, Bld 134 (*)    Calcium 8.8 (*)    All other components within normal limits  CBC  BRAIN NATRIURETIC PEPTIDE  I-STAT TROPONIN, ED    EKG EKG Interpretation  Date/Time:  Monday August 14 2018 12:06:06 EST Ventricular Rate:  64 PR Interval:  150 QRS Duration: 108 QT Interval:  408 QTC Calculation: 420 R Axis:   80 Text Interpretation:  Normal sinus rhythm Incomplete right bundle branch block Borderline ECG No acute changes No significant change since last tracing Confirmed by Derwood Kaplan [phone]) on 08/14/2018 3:20:17 PM   Radiology Dg Chest 2 View  Result Date: 08/14/2018 CLINICAL DATA:  Shortness of Breath EXAM: CHEST - 2 VIEW COMPARISON:  August 30, 2016 FINDINGS: There is no appreciable edema or consolidation. Heart is upper normal in size with pulmonary vascularity within normal limits. No adenopathy. There is lower thoracic dextroscoliosis. IMPRESSION: No edema or consolidation.  Stable cardiac silhouette. Electronically Signed   By: Bretta Bang III M.D.   On: 08/14/2018 12:43   Dg Toe Great Left  Result Date: 08/14/2018 CLINICAL DATA:  Left great toe swelling without known injury. EXAM: LEFT GREAT TOE COMPARISON:  None. FINDINGS: There is no evidence of fracture or  dislocation. Moderate narrowing of the first interphalangeal joint is noted. No lytic destruction is seen to suggest osteomyelitis. Soft tissues are unremarkable. 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 No acute abnormality seen in the left great toe. Electronically Signed   By: Lupita Raider, M.D.   On: 08/14/2018 12:45   Vas [place] Lower Extremity Venous (dvt) (only [facility])  Result Date: 08/14/2018  Lower Venous Study Indications: Swelling.  Limitations: Body habitus and poor ultrasound/tissue interface. Performing Technologist: Gertie Fey MHA, RDMS, RVT, RDCS  Examination Guidelines: A complete evaluation includes B-mode imaging, spectral Doppler, color Doppler, and power Doppler as needed of all accessible portions of each vessel. Bilateral testing is considered an integral part of a complete examination.  Limited examinations for reoccurring indications may be performed as noted.  Right Venous Findings: +---+---------------+---------+-----------+----------+--------------+    CompressibilityPhasicitySpontaneityPropertiesSummary        +---+---------------+---------+-----------+----------+--------------+ CFV                                             Not visualized +---+---------------+---------+-----------+----------+--------------+  Left Venous Findings: +---------+---------------+---------+-----------+----------+--------------+          CompressibilityPhasicitySpontaneityPropertiesSummary        +---------+---------------+---------+-----------+----------+--------------+ CFV      Full                                                        +---------+---------------+---------+-----------+----------+--------------+ SFJ      Full                                                        +---------+---------------+---------+-----------+----------+--------------+ FV Prox  Full                                                         +---------+---------------+---------+-----------+----------+--------------+ FV Mid   Full                                                        +---------+---------------+---------+-----------+----------+--------------+ FV DistalFull                                                        +---------+---------------+---------+-----------+----------+--------------+ POP      Full                                                        +---------+---------------+---------+-----------+----------+--------------+ PTV                                                   Not visualized +---------+---------------+---------+-----------+----------+--------------+ PERO                                                  Not visualized +---------+---------------+---------+-----------+----------+--------------+    Summary: Left: There is no evidence of deep vein thrombosis in the lower extremity. However, portions of this examination were limited- see technologist  comments above. No cystic structure found in the popliteal fossa.  *See table(s) above for measurements and observations.    Preliminary     Procedures Procedures (including critical care time)  Medications Ordered in ED Medications  sodium chloride flush (NS) 0.9 % injection 3 mL (has no administration in time range)     Initial Impression / Assessment and Plan / ED Course  I have reviewed the triage vital signs and the nursing notes.  Pertinent labs & imaging results that were available during my care of the patient were reviewed by me and considered in my medical decision making (see chart for details).        43 year old male significant past medical history of tobacco, hyperlipidemia, hypertension, venous stasis who presents with left big toe pain after he believes he might have stubbed his toe few days ago.  Patient has mild bruising, ecchymoses around the toe.  Patient denies any other symptoms besides a mild  cough.  Also endorses some worsening of his venous stasis in his lower extremities.  Patient is resting comfortably on room air, setting on recent.  Physical exam is pertinent as stated above for possible fracture of the toe.  We will also assess laboratory studies due to increased volume overload however her lungs are clear to auscultation, no crackles, no increased respiratory effort.  Patient follows with family medicine.  Will perform basic laboratory studies, x-ray.  X-ray indicates no acute fracture, possible musculoskeletal strain or injury based on exam.  No acute intervention necessary at this time.  Laboratory studies indicate no acute etiology to intervene at this time.  No indication for admission.  Patient is to follow-up with primary care provider, no indications for cellulitis or infection at the site of the toe.  No DVT in the lower extremity due to increased swelling.  Patient in agreement with this plan is can follow-up with family medicine.  Patient given strict return precautions if symptoms change or worsen in character.  The above care was discussed and agreed upon by my attending physician.  Final Clinical Impressions(s) / ED Diagnoses   Final diagnoses:  Toe pain, left  Peripheral edema    ED Discharge Orders    None       Dahlia Client, MD 08/14/18 1652    Derwood Kaplan, MD 08/15/18 [phone]

## 2018-08-14 NOTE — ED Notes (Signed)
Patient verbalizes understanding of discharge instructions. Opportunity for questioning and answers were provided. Armband removed by staff, pt discharged from ED.  

## 2020-02-06 DIAGNOSIS — I361 Nonrheumatic tricuspid (valve) insufficiency: Secondary | ICD-10-CM

## 2023-06-20 ENCOUNTER — Ambulatory Visit: Payer: Self-pay | Admitting: Nurse Practitioner

## 2023-08-03 ENCOUNTER — Ambulatory Visit: Payer: Medicaid Other | Admitting: Nurse Practitioner

## 2023-08-09 ENCOUNTER — Encounter: Payer: Self-pay | Admitting: Nurse Practitioner

## 2023-08-09 ENCOUNTER — Ambulatory Visit (INDEPENDENT_AMBULATORY_CARE_PROVIDER_SITE_OTHER): Payer: Medicaid Other | Admitting: Nurse Practitioner

## 2023-08-09 VITALS — BP 115/82 | HR 83 | Temp 98.1°F | Ht 69.0 in | Wt 303.0 lb

## 2023-08-09 DIAGNOSIS — Z13228 Encounter for screening for other metabolic disorders: Secondary | ICD-10-CM

## 2023-08-09 DIAGNOSIS — Z720 Tobacco use: Secondary | ICD-10-CM

## 2023-08-09 DIAGNOSIS — I890 Lymphedema, not elsewhere classified: Secondary | ICD-10-CM | POA: Insufficient documentation

## 2023-08-09 DIAGNOSIS — Z130 Encounter for screening for diseases of the blood and blood-forming organs and certain disorders involving the immune mechanism: Secondary | ICD-10-CM

## 2023-08-09 DIAGNOSIS — F419 Anxiety disorder, unspecified: Secondary | ICD-10-CM | POA: Diagnosis not present

## 2023-08-09 DIAGNOSIS — Z1321 Encounter for screening for nutritional disorder: Secondary | ICD-10-CM

## 2023-08-09 DIAGNOSIS — I10 Essential (primary) hypertension: Secondary | ICD-10-CM

## 2023-08-09 DIAGNOSIS — Z1329 Encounter for screening for other suspected endocrine disorder: Secondary | ICD-10-CM | POA: Diagnosis not present

## 2023-08-09 DIAGNOSIS — M7989 Other specified soft tissue disorders: Secondary | ICD-10-CM | POA: Insufficient documentation

## 2023-08-09 DIAGNOSIS — F32A Depression, unspecified: Secondary | ICD-10-CM

## 2023-08-09 MED ORDER — SERTRALINE HCL 50 MG PO TABS: 50.0000 mg | ORAL_TABLET | Freq: Every day | ORAL | 1 refills | Status: DC

## 2023-08-09 NOTE — Patient Instructions (Addendum)
 Around 3 times per week, check your blood pressure 2 times per day. once in the morning and once in the evening. The readings should be at least one minute apart. Write down these values and bring them to your next nurse visit/appointment.  When you check your BP, make sure you have been doing something calm/relaxing 5 minutes prior to checking. Both feet should be flat on the floor and you should be sitting. Use your left arm and make sure it is in a relaxed position (on a table), and that the cuff is at the approximate level/height of your heart.    . Lymphedema  - Ambulatory referral to Vascular Surgery  . Anxiety and depression  - sertraline (ZOLOFT) 50 MG tablet; Take 1 tablet (50 mg total) by mouth daily.  Dispense: 60 tablet; Refill: 1  . Tobacco use   It is important that you exercise regularly at least 30 minutes 5 times a week as tolerated  Think about what you will eat, plan ahead. Choose " clean, green, fresh or frozen" over canned, processed or packaged foods which are more sugary, salty and fatty. 70 to 75% of food eaten should be vegetables and fruit. Three meals at set times with snacks allowed between meals, but they must be fruit or vegetables. Aim to eat over a 12 hour period , example 7 am to 7 pm, and STOP after  your last meal of the day. Drink water,generally about 64 ounces per day, no other drink is as healthy. Fruit juice is best enjoyed in a healthy way, by EATING the fruit.  Thanks for choosing [facility] we consider it a privelige to serve you.

## 2023-08-09 NOTE — Progress Notes (Signed)
 New Patient Office Visit  Subjective:  Patient ID: [name], male    DOB: 1976-01-07  Age: 48 y.o. MRN: [id number]  CC:  Chief Complaint  Patient presents with   Diabetes   Establish Care    HPI [name] is a 48 y.o. male  has a past medical history of Arthritis, High cholesterol, and Hypertension.  Patient presented establish care for his chronic medical conditions.  Previous PCP was at [facility] family [facility], stated that his last visit was over a year ago.  Records requested from previous PCP  Anxiety and depression patient is most concerned about his chronic anxiety and depression.  Was on Zoloft 100 mg in the past.  Does not know month is causing his anxiety and depression.  Currently denies SI, HI.  He is interested in restarting medication  Chronic bilateral lower extremity swelling   Stated that that he has been evaluated by vascular in the past also had an echocardiogram done that was normal.  Patient denies shortness of breath, leg pain  Tobacco use disorder.  Smokes 1 pack of cigarettes daily.  Currently denies cough, shortness of breath, wheezing.  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     Past Medical History:  Diagnosis Date   Arthritis    High cholesterol    Hypertension     Past Surgical History:  Procedure Laterality Date   CARPAL TUNNEL RELEASE     CHOLECYSTECTOMY     EYE SURGERY     TESTICLE SURGERY      Family History  Problem Relation Age of Onset   High blood pressure Brother    Diabetes Brother    Hyperlipidemia Brother    Kidney disease Brother     Social History   Socioeconomic History   Marital status: Single    Spouse name: Not on file   Number of children: Not on file   Years of education: Not on file   Highest education level: Not on file  Occupational History   Not on file  Tobacco Use   Smoking status: Every Day    Current packs/day: 1.00    Types: Cigarettes    Smokeless tobacco: Not on file  Substance and Sexual Activity   Alcohol use: No   Drug use: Not Currently   Sexual activity: Not on file  Other Topics Concern   Not on file  Social History Narrative   Lives with his brother    Social Drivers of Corporate investment banker Strain: Not on file  Food Insecurity: Not on file  Transportation Needs: Not on file  Physical Activity: Not on file  Stress: Not on file  Social Connections: Not on file  Intimate Partner Violence: Not on file    ROS Review of Systems  Constitutional:  Negative for appetite change, chills, fatigue and fever.  HENT:  Negative for congestion, postnasal drip, rhinorrhea and sneezing.   Respiratory:  Negative for cough, shortness of breath and wheezing.   Cardiovascular:  Positive for leg swelling. Negative for chest pain and palpitations.  Gastrointestinal:  Negative for abdominal pain, constipation, nausea and vomiting.  Genitourinary:  Negative for difficulty urinating, dysuria, flank pain and frequency.  Musculoskeletal:  Negative for arthralgias, back pain, joint swelling and myalgias.  Skin:  Negative for pallor, rash and wound.  Neurological:  Negative for dizziness, facial asymmetry, weakness, numbness and headaches.  Psychiatric/Behavioral:  Negative for  behavioral problems, confusion, self-injury and suicidal ideas.     Objective:   Today's Vitals: BP 115/82   Pulse 83   Temp 98.1 F (36.7 C)   Ht 5\' 9"  (1.753 m)   Wt (!) 303 lb (137.4 kg)   SpO2 97%   BMI 44.75 kg/m   Physical Exam Vitals and nursing note reviewed.  Constitutional:      General: He is not in acute distress.    Appearance: Normal appearance. He is obese. He is not ill-appearing, toxic-appearing or diaphoretic.  HENT:     Mouth/Throat:     Mouth: Mucous membranes are moist.     Pharynx: Oropharynx is clear. No oropharyngeal exudate or posterior oropharyngeal erythema.  Eyes:     General: No scleral icterus.       Right  eye: No discharge.        Left eye: No discharge.     Extraocular Movements: Extraocular movements intact.     Conjunctiva/sclera: Conjunctivae normal.  Cardiovascular:     Rate and Rhythm: Normal rate and regular rhythm.     Pulses: Normal pulses.     Heart sounds: Normal heart sounds. No murmur heard.    No friction rub. No gallop.  Pulmonary:     Effort: Pulmonary effort is normal. No respiratory distress.     Breath sounds: Normal breath sounds. No stridor. No wheezing, rhonchi or rales.  Chest:     Chest wall: No tenderness.  Abdominal:     General: There is no distension.     Palpations: Abdomen is soft.     Tenderness: There is no abdominal tenderness. There is no right CVA tenderness, left CVA tenderness or guarding.  Musculoskeletal:        General: No swelling, tenderness, deformity or signs of injury.     Right lower leg: No edema.     Left lower leg: No edema.     Comments: Skin on bilateral lower extremities appears dry, skin lesions noted.  No redness, drainage noted on examination  Skin:    General: Skin is warm and dry.     Coloration: Skin is not jaundiced or pale.     Findings: Lesion present. No bruising or erythema.     Comments: Bilateral lower extremities  Neurological:     Mental Status: He is alert and oriented to person, place, and time.     Motor: No weakness.     Coordination: Coordination normal.     Gait: Gait normal.  Psychiatric:        Mood and Affect: Mood normal.        Behavior: Behavior normal.        Thought Content: Thought content normal.        Judgment: Judgment normal.     Assessment & Plan:   Problem List Items Addressed This Visit       Cardiovascular and Mediastinum   High blood pressure   BP Readings from Last 3 Encounters:  08/09/23 115/82  08/14/18 107/67  08/30/16 107/67  Was on atenolol, furosemide and lisinopril in the past Blood pressure is currently well-controlled without medication States that his blood  pressure is sometimes elevated at home Patient encouraged to monitor blood pressure at home keep a log and bring to next visit in 4 weeks      Relevant Medications   lisinopril (ZESTRIL) 40 MG tablet   atorvastatin (LIPITOR) 40 MG tablet   furosemide (LASIX) 20 MG tablet  Other   Anxiety and depression      08/09/2023    3:40 PM  Depression screen PHQ 2/9  Decreased Interest 3  Down, Depressed, Hopeless 3  PHQ - 2 Score 6  Altered sleeping 3  Tired, decreased energy 3  Change in appetite 3  Feeling bad or failure about yourself  3  Trouble concentrating 3  Moving slowly or fidgety/restless 3  Suicidal thoughts 1  PHQ-9 Score 25  Difficult doing work/chores Somewhat difficult      08/09/2023    3:39 PM  GAD 7 : Generalized Anxiety Score  Nervous, Anxious, on Edge 3  Control/stop worrying 3  Worry too much - different things 3  Trouble relaxing 3  Restless 3  Easily annoyed or irritable 3  Afraid - awful might happen 3  Total GAD 7 Score 21  Anxiety Difficulty Extremely difficult   Start Lexapro 50 mg daily, increase to 100 mg after 1 week if well-tolerated Patient denies SI, HI Follow-up in 4 weeks        Relevant Medications   sertraline (ZOLOFT) 50 MG tablet   Tobacco use   Smokes about 1 pack/day  Asked about quitting: confirms that he/she currently smokes cigarettes Advise to quit smoking: Educated about QUITTING to reduce the risk of cancer, cardio and cerebrovascular disease. Assess willingness: Unwilling to quit at this time, not working on cutting back. Assist with counseling and pharmacotherapy: Counseled for 5 minutes and literature provided. Arrange for follow up: follow up in 1 month and continue to offer help.       Leg swelling   Patient referred to vascular for further evaluation and recommendations      Relevant Orders   Ambulatory referral to Vascular Surgery   Other Visit Diagnoses       Screening for endocrine, nutritional,  metabolic and immunity disorder    -  Primary       Outpatient Encounter Medications as of 08/09/2023  Medication Sig   atenolol (TENORMIN) 100 MG tablet Take 100 mg by mouth daily.   atorvastatin (LIPITOR) 40 MG tablet Take 40 mg by mouth daily.   furosemide (LASIX) 20 MG tablet Take 20 mg by mouth.   lisinopril (ZESTRIL) 40 MG tablet Take 40 mg by mouth daily.   potassium chloride (KLOR-CON M) 10 MEQ tablet Take 10 mEq by mouth 2 (two) times daily.   sertraline (ZOLOFT) 50 MG tablet Take 1 tablet (50 mg total) by mouth daily.   [DISCONTINUED] sertraline (ZOLOFT) 100 MG tablet Take 200 mg by mouth daily.   [DISCONTINUED] albuterol (PROVENTIL HFA;VENTOLIN HFA) 108 (90 Base) MCG/ACT inhaler Inhale 1-2 puffs into the lungs every 6 (six) hours as needed for wheezing or shortness of breath.   [DISCONTINUED] atorvastatin (LIPITOR) 20 MG tablet Take 20 mg by mouth daily.   [DISCONTINUED] hydrochlorothiazide (HYDRODIURIL) 25 MG tablet Take 25 mg by mouth daily.   [DISCONTINUED] lisinopril-hydrochlorothiazide (PRINZIDE,ZESTORETIC) 10-12.5 MG tablet Take 1 tablet by mouth daily.   No facility-administered encounter medications on file as of 08/09/2023.    Follow-up: Return in about 4 weeks (around 09/06/2023) for CPE.   Donell Beers, FNP

## 2023-08-09 NOTE — Assessment & Plan Note (Addendum)
 Patient referred to vascular for further evaluation and recommendations

## 2023-08-09 NOTE — Assessment & Plan Note (Signed)
 Smokes about 1 pack/day  Asked about quitting: confirms that he/she currently smokes cigarettes Advise to quit smoking: Educated about QUITTING to reduce the risk of cancer, cardio and cerebrovascular disease. Assess willingness: Unwilling to quit at this time, not working on cutting back. Assist with counseling and pharmacotherapy: Counseled for 5 minutes and literature provided. Arrange for follow up: follow up in 1 month and continue to offer help.

## 2023-08-09 NOTE — Assessment & Plan Note (Signed)
 BP Readings from Last 3 Encounters:  08/09/23 115/82  08/14/18 107/67  08/30/16 107/67  Was on atenolol, furosemide and lisinopril in the past Blood pressure is currently well-controlled without medication States that his blood pressure is sometimes elevated at home Patient encouraged to monitor blood pressure at home keep a log and bring to next visit in 4 weeks

## 2023-08-09 NOTE — Assessment & Plan Note (Addendum)
    08/09/2023    3:40 PM  Depression screen PHQ 2/9  Decreased Interest 3  Down, Depressed, Hopeless 3  PHQ - 2 Score 6  Altered sleeping 3  Tired, decreased energy 3  Change in appetite 3  Feeling bad or failure about yourself  3  Trouble concentrating 3  Moving slowly or fidgety/restless 3  Suicidal thoughts 1  PHQ-9 Score 25  Difficult doing work/chores Somewhat difficult      08/09/2023    3:39 PM  GAD 7 : Generalized Anxiety Score  Nervous, Anxious, on Edge 3  Control/stop worrying 3  Worry too much - different things 3  Trouble relaxing 3  Restless 3  Easily annoyed or irritable 3  Afraid - awful might happen 3  Total GAD 7 Score 21  Anxiety Difficulty Extremely difficult   Start Lexapro 50 mg daily, increase to 100 mg after 1 week if well-tolerated Patient denies SI, HI Follow-up in 4 weeks

## 2023-09-14 ENCOUNTER — Encounter: Payer: Self-pay | Admitting: Nurse Practitioner

## 2023-09-14 ENCOUNTER — Ambulatory Visit (INDEPENDENT_AMBULATORY_CARE_PROVIDER_SITE_OTHER): Payer: Medicaid Other | Admitting: Nurse Practitioner

## 2023-09-14 VITALS — BP 147/90 | HR 76 | Temp 97.9°F | Wt 306.0 lb

## 2023-09-14 DIAGNOSIS — Z23 Encounter for immunization: Secondary | ICD-10-CM | POA: Diagnosis not present

## 2023-09-14 DIAGNOSIS — Z0000 Encounter for general adult medical examination without abnormal findings: Secondary | ICD-10-CM | POA: Insufficient documentation

## 2023-09-14 DIAGNOSIS — Z1329 Encounter for screening for other suspected endocrine disorder: Secondary | ICD-10-CM

## 2023-09-14 DIAGNOSIS — Z13228 Encounter for screening for other metabolic disorders: Secondary | ICD-10-CM | POA: Diagnosis not present

## 2023-09-14 DIAGNOSIS — Z130 Encounter for screening for diseases of the blood and blood-forming organs and certain disorders involving the immune mechanism: Secondary | ICD-10-CM | POA: Diagnosis not present

## 2023-09-14 DIAGNOSIS — E1365 Other specified diabetes mellitus with hyperglycemia: Secondary | ICD-10-CM

## 2023-09-14 DIAGNOSIS — F32A Depression, unspecified: Secondary | ICD-10-CM

## 2023-09-14 DIAGNOSIS — Z1211 Encounter for screening for malignant neoplasm of colon: Secondary | ICD-10-CM | POA: Diagnosis not present

## 2023-09-14 DIAGNOSIS — Z1321 Encounter for screening for nutritional disorder: Secondary | ICD-10-CM | POA: Diagnosis not present

## 2023-09-14 DIAGNOSIS — F419 Anxiety disorder, unspecified: Secondary | ICD-10-CM

## 2023-09-14 DIAGNOSIS — E66813 Obesity, class 3: Secondary | ICD-10-CM

## 2023-09-14 DIAGNOSIS — Z720 Tobacco use: Secondary | ICD-10-CM

## 2023-09-14 DIAGNOSIS — I10 Essential (primary) hypertension: Secondary | ICD-10-CM

## 2023-09-14 DIAGNOSIS — E1165 Type 2 diabetes mellitus with hyperglycemia: Secondary | ICD-10-CM | POA: Insufficient documentation

## 2023-09-14 DIAGNOSIS — Z6841 Body Mass Index (BMI) 40.0 and over, adult: Secondary | ICD-10-CM

## 2023-09-14 LAB — POCT GLYCOSYLATED HEMOGLOBIN (HGB A1C): Hemoglobin A1C: 10 % — AB (ref 4.0–5.6)

## 2023-09-14 MED ORDER — BLOOD GLUCOSE TEST VI STRP: 1.0000 | ORAL_STRIP | Freq: Three times a day (TID) | 1 refills | Status: AC

## 2023-09-14 MED ORDER — BLOOD GLUCOSE MONITORING SUPPL DEVI: 1.0000 | Freq: Three times a day (TID) | 0 refills | Status: AC

## 2023-09-14 MED ORDER — METFORMIN HCL ER 500 MG PO TB24: 500.0000 mg | ORAL_TABLET | Freq: Two times a day (BID) | ORAL | 1 refills | Status: AC

## 2023-09-14 MED ORDER — HYDROCHLOROTHIAZIDE 12.5 MG PO TABS: 12.5000 mg | ORAL_TABLET | Freq: Every day | ORAL | 1 refills | Status: DC

## 2023-09-14 MED ORDER — LANCETS MISC. MISC: 1.0000 | Freq: Three times a day (TID) | 2 refills | Status: AC

## 2023-09-14 MED ORDER — LISINOPRIL 10 MG PO TABS: 10.0000 mg | ORAL_TABLET | Freq: Every day | ORAL | 3 refills | Status: AC

## 2023-09-14 MED ORDER — LANTUS SOLOSTAR 100 UNIT/ML ~~LOC~~ SOPN: 10.0000 [IU] | PEN_INJECTOR | Freq: Every day | SUBCUTANEOUS | 2 refills | Status: AC

## 2023-09-14 MED ORDER — LANCET DEVICE MISC: 1.0000 | Freq: Three times a day (TID) | 0 refills | Status: AC

## 2023-09-14 NOTE — Assessment & Plan Note (Addendum)
    09/14/2023   10:28 AM 08/09/2023    3:40 PM  Depression screen PHQ 2/9  Decreased Interest 0 3  Down, Depressed, Hopeless 0 3  PHQ - 2 Score 0 6  Altered sleeping 1 3  Tired, decreased energy 1 3  Change in appetite 0 3  Feeling bad or failure about yourself  0 3  Trouble concentrating 0 3  Moving slowly or fidgety/restless 0 3  Suicidal thoughts 0 1  PHQ-9 Score 2 25  Difficult doing work/chores Somewhat difficult Somewhat difficult        09/14/2023   10:29 AM 08/09/2023    3:39 PM  GAD 7 : Generalized Anxiety Score  Nervous, Anxious, on Edge 1 3  Control/stop worrying 0 3  Worry too much - different things 1 3  Trouble relaxing 1 3  Restless 0 3  Easily annoyed or irritable 1 3  Afraid - awful might happen 0 3  Total GAD 7 Score 4 21  Anxiety Difficulty Somewhat difficult Extremely difficult  Much improved on Zoloft 50 mg daily Continue current medication

## 2023-09-14 NOTE — Assessment & Plan Note (Addendum)
 Lab Results  Component Value Date   HGBA1C 10.0 (A) 09/14/2023  New diagnosis of type 2 diabetes Patient reports polyuria, polydipsia denies polyphagia Start Lantus 10 units daily, metformin 500 mg twice daily Glucometer ordered CBG goals discussed, treatment for hypoglycemia discussed Checking lipid panel, urine microalbumin labs, patient referred for diabetic eye exam Patient counseled on low-carb diet, encouraged to lose weight He declined referral for diabetes nutrition education Follow-up in 4 weeks

## 2023-09-14 NOTE — Assessment & Plan Note (Signed)
Patient educated on CDC recommendation for the vaccine. Verbal consent was obtained from the patient, vaccine administered by nurse, no sign of adverse reactions noted at this time. Patient education on arm soreness and use of tylenol orfor this patient  was discussed. Patient educated on the signs and symptoms of adverse effect and advise to contact the office if they occur 

## 2023-09-14 NOTE — Assessment & Plan Note (Signed)
 Smokes about 1 pack/day since age 48 .  Patient denies cough, shortness of breath, wheezing Asked about quitting: confirms that he/she currently smokes cigarettes Advise to quit smoking: Educated about QUITTING to reduce the risk of cancer, cardio and cerebrovascular disease. Assess willingness: Unwilling to quit at this time, but is working on cutting back. Assist with counseling and pharmacotherapy: Counseled for 5 minutes and literature provided. Arrange for follow up: follow up in 1 month and continue to offer help.

## 2023-09-14 NOTE — Patient Instructions (Addendum)
 Please consider getting pneumococcal  and Tdap vaccine at local pharmacy.    . Primary hypertension  - lisinopril (ZESTRIL) 10 MG tablet; Take 1 tablet (10 mg total) by mouth daily.  Dispense: 90 tablet; Refill: 3   Goal for fasting blood sugar ranges from 80 to 120 and 2 hours after any meal or at bedtime should be between 130 to 170.   Marland Kitchen Uncontrolled other specified diabetes mellitus with hyperglycemia (HCC)  - insulin glargine (LANTUS SOLOSTAR) 100 UNIT/ML Solostar Pen; Inject 10 Units into the skin daily.  Dispense: 15 mL; Refill: 2 - metFORMIN (GLUCOPHAGE-XR) 500 MG 24 hr tablet; Take 1 tablet (500 mg total) by mouth 2 (two) times daily with a meal.  Dispense: 180 tablet; Refill: 1 - Blood Glucose Monitoring Suppl DEVI; 1 each by Does not apply route in the morning, at noon, and at bedtime. May substitute to any manufacturer covered by patient's insurance.  Dispense: 1 each; Refill: 0 - Glucose Blood (BLOOD GLUCOSE TEST STRIPS) STRP; 1 each by In Vitro route in the morning, at noon, and at bedtime. May substitute to any manufacturer covered by patient's insurance.  Dispense: 100 strip; Refill: 1 - Lancet Device MISC; 1 each by Does not apply route in the morning, at noon, and at bedtime. May substitute to any manufacturer covered by patient's insurance.  Dispense: 1 each; Refill: 0 - Lancets Misc. MISC; 1 each by Does not apply route in the morning, at noon, and at bedtime. May substitute to any manufacturer covered by patient's insurance.  Dispense: 100 each; Refill: 2 - lisinopril (ZESTRIL) 10 MG tablet; Take 1 tablet (10 mg total) by mouth daily.  Dispense: 90 tablet; Refill: 3 - Microalbumin / creatinine urine ratio        It is important that you exercise regularly at least 30 minutes 5 times a week as tolerated  Think about what you will eat, plan ahead. Choose " clean, green, fresh or frozen" over canned, processed or packaged foods which are more sugary, salty and fatty. 70  to 75% of food eaten should be vegetables and fruit. Three meals at set times with snacks allowed between meals, but they must be fruit or vegetables. Aim to eat over a 12 hour period , example 7 am to 7 pm, and STOP after  your last meal of the day. Drink water,generally about 64 ounces per day, no other drink is as healthy. Fruit juice is best enjoyed in a healthy way, by EATING the fruit.  Thanks for choosing [facility] we consider it a privelige to serve you.

## 2023-09-14 NOTE — Progress Notes (Signed)
 Complete physical exam  Patient: [name]   DOB: 18-Feb-1976   48 y.o. Male  MRN: [id number]  Subjective:    Chief Complaint  Patient presents with   Medical Management of Chronic Issues    [name] is a 48 y.o. male  has a past medical history of Arthritis, High cholesterol, and Hypertension. who presents today for a complete physical exam. He reports consuming a general diet. The patient does not participate in regular exercise at present. He generally feels fairly well. He reports sleeping poorly.  Has upcoming appointment for a sleep study  Flu vaccine given in the office today encouraged to get Tdap vaccine, pneumococcal vaccine at the pharmacy.     Most recent fall risk assessment:     No data to display           Most recent depression screenings:    09/14/2023   10:28 AM 08/09/2023    3:40 PM  PHQ 2/9 Scores  PHQ - 2 Score 0 6  PHQ- 9 Score 2 25        Patient Care Team: Donell Beers, FNP as PCP - General (Nurse Practitioner)   Outpatient Medications Prior to Visit  Medication Sig   sertraline (ZOLOFT) 50 MG tablet Take 1 tablet (50 mg total) by mouth daily.   atorvastatin (LIPITOR) 40 MG tablet Take 40 mg by mouth daily. (Patient not taking: Reported on 09/14/2023)   [DISCONTINUED] atenolol (TENORMIN) 100 MG tablet Take 100 mg by mouth daily. (Patient not taking: Reported on 08/09/2023)   [DISCONTINUED] furosemide (LASIX) 20 MG tablet Take 20 mg by mouth. (Patient not taking: Reported on 09/14/2023)   [DISCONTINUED] lisinopril (ZESTRIL) 40 MG tablet Take 40 mg by mouth daily. (Patient not taking: Reported on 09/14/2023)   [DISCONTINUED] potassium chloride (KLOR-CON M) 10 MEQ tablet Take 10 mEq by mouth 2 (two) times daily. (Patient not taking: Reported on 09/14/2023)   No facility-administered medications prior to visit.    Review of Systems  Constitutional:  Negative for appetite change, chills, fatigue and fever.  HENT:  Negative  for congestion, postnasal drip, rhinorrhea and sneezing.   Eyes:  Negative for pain, discharge and itching.  Respiratory:  Negative for cough, shortness of breath and wheezing.   Cardiovascular:  Negative for chest pain, palpitations and leg swelling.  Gastrointestinal:  Negative for abdominal pain, constipation, nausea and vomiting.  Endocrine: Positive for polyphagia and polyuria. Negative for polydipsia.  Genitourinary:  Negative for difficulty urinating, dysuria, flank pain and frequency.  Musculoskeletal:  Positive for joint swelling. Negative for arthralgias, back pain and myalgias.  Skin:  Negative for color change, pallor, rash and wound.  Neurological:  Negative for dizziness, facial asymmetry, weakness, numbness and headaches.  Psychiatric/Behavioral:  Negative for behavioral problems, confusion, self-injury and suicidal ideas.        Objective:     BP (!) 147/90   Pulse 76   Temp 97.9 F (36.6 C) (Oral)   Wt (!) 306 lb (138.8 kg)   SpO2 96%   BMI 45.19 kg/m    Physical Exam Vitals and nursing note reviewed. Exam conducted with a chaperone present.  Constitutional:      General: He is not in acute distress.    Appearance: Normal appearance. He is obese. He is not ill-appearing, toxic-appearing or diaphoretic.  HENT:     Head:     Comments: Poor dental hygiene    Right Ear: Tympanic membrane, ear canal and external  ear normal. There is impacted cerumen.     Left Ear: Tympanic membrane, ear canal and external ear normal. There is no impacted cerumen.     Nose: Nose normal. No congestion or rhinorrhea.     Mouth/Throat:     Mouth: Mucous membranes are moist.     Pharynx: Oropharynx is clear. No oropharyngeal exudate or posterior oropharyngeal erythema.  Eyes:     General: No scleral icterus.       Right eye: No discharge.        Left eye: No discharge.     Extraocular Movements: Extraocular movements intact.     Conjunctiva/sclera: Conjunctivae normal.  Neck:      Vascular: No carotid bruit.  Cardiovascular:     Rate and Rhythm: Normal rate and regular rhythm.     Pulses: Normal pulses.     Heart sounds: Normal heart sounds. No murmur heard.    No friction rub. No gallop.  Pulmonary:     Effort: Pulmonary effort is normal. No respiratory distress.     Breath sounds: Normal breath sounds. No stridor. No wheezing, rhonchi or rales.  Chest:     Chest wall: No tenderness.  Abdominal:     General: Bowel sounds are normal. There is no distension.     Palpations: Abdomen is soft. There is no mass.     Tenderness: There is no abdominal tenderness. There is no right CVA tenderness, left CVA tenderness, guarding or rebound.     Hernia: No hernia is present.  Musculoskeletal:        General: No tenderness or signs of injury.     Cervical back: Normal range of motion and neck supple. No rigidity or tenderness.     Right lower leg: Edema present.     Left lower leg: Edema present.     Comments: Non pitting edema  Lymphadenopathy:     Cervical: No cervical adenopathy.  Skin:    General: Skin is warm and dry.     Coloration: Skin is not jaundiced or pale.     Findings: Lesion present. No bruising or erythema.     Comments: Skin on bilateral lower extremities appears dry, skin lesions noted.  No redness, drainage noted on examination   Poor hygiene  Neurological:     Mental Status: He is alert and oriented to person, place, and time.     Cranial Nerves: No cranial nerve deficit.     Sensory: No sensory deficit.     Motor: No weakness.     Coordination: Coordination normal.     Gait: Gait normal.     Deep Tendon Reflexes: Reflexes normal.  Psychiatric:        Mood and Affect: Mood normal.        Behavior: Behavior normal.        Thought Content: Thought content normal.        Judgment: Judgment normal.    Results for orders placed or performed in visit on 09/14/23  POCT glycosylated hemoglobin (Hb A1C)  Result Value Ref Range   Hemoglobin A1C  10.0 (A) 4.0 - 5.6 %   HbA1c POC (<> result, manual entry)     HbA1c, POC (prediabetic range)     HbA1c, POC (controlled diabetic range)         Assessment & Plan:    Routine Health Maintenance and Physical Exam  Immunization History  Administered Date(s) Administered   Influenza, Seasonal, Injecte, Preservative Fre 09/14/2023  Health Maintenance  Topic Date Due   Pneumococcal Vaccine 68-70 Years old (1 of 2 - PCV) Never done   FOOT EXAM  Never done   OPHTHALMOLOGY EXAM  Never done   HIV Screening  Never done   Diabetic kidney evaluation - Urine ACR  Never done   Hepatitis C Screening  Never done   DTaP/Tdap/Td (1 - Tdap) Never done   Diabetic kidney evaluation - eGFR measurement  08/15/2019   Colonoscopy  Never done   COVID-19 Vaccine (1 - 2024-25 season) Never done   HEMOGLOBIN A1C  03/16/2024   INFLUENZA VACCINE  Completed   HPV VACCINES  Aged Out    Discussed health benefits of physical activity, and encouraged him to engage in regular exercise appropriate for his age and condition.  Problem List Items Addressed This Visit       Cardiovascular and Mediastinum   High blood pressure   Start lisinopril 10 mg daily DASH diet and commitment to daily physical activity for a minimum of 30 minutes discussed and encouraged, as a part of hypertension management. The importance of attaining a healthy weight is also discussed. Follow-up in 4 weeks     09/14/2023    9:43 AM 09/14/2023    9:21 AM 09/14/2023    9:16 AM 08/09/2023    3:43 PM 08/14/2018    5:09 PM 08/14/2018    4:45 PM 08/14/2018    4:30 PM  BP/Weight  Systolic BP 147 143 142 115 107 107 111  Diastolic BP 90 84 88 82 67 67 70  Wt. (Lbs)   306 303     BMI   45.19 kg/m2 44.75 kg/m2              Relevant Medications   lisinopril (ZESTRIL) 10 MG tablet     Endocrine   Uncontrolled diabetes mellitus with hyperglycemia (HCC)   Lab Results  Component Value Date   HGBA1C 10.0 (A) 09/14/2023  New  diagnosis of type 2 diabetes Patient reports polyuria, polydipsia denies polyphagia Start Lantus 10 units daily, metformin 500 mg twice daily Glucometer ordered CBG goals discussed, treatment for hypoglycemia discussed Checking lipid panel, urine microalbumin labs, patient referred for diabetic eye exam Patient counseled on low-carb diet, encouraged to lose weight He declined referral for diabetes nutrition education Follow-up in 4 weeks      Relevant Medications   insulin glargine (LANTUS SOLOSTAR) 100 UNIT/ML Solostar Pen   metFORMIN (GLUCOPHAGE-XR) 500 MG 24 hr tablet   Blood Glucose Monitoring Suppl DEVI   Glucose Blood (BLOOD GLUCOSE TEST STRIPS) STRP   Lancet Device MISC   Lancets Misc. MISC   lisinopril (ZESTRIL) 10 MG tablet   Other Relevant Orders   Microalbumin / creatinine urine ratio   Ambulatory referral to Ophthalmology     Other   Anxiety and depression      09/14/2023   10:28 AM 08/09/2023    3:40 PM  Depression screen PHQ 2/9  Decreased Interest 0 3  Down, Depressed, Hopeless 0 3  PHQ - 2 Score 0 6  Altered sleeping 1 3  Tired, decreased energy 1 3  Change in appetite 0 3  Feeling bad or failure about yourself  0 3  Trouble concentrating 0 3  Moving slowly or fidgety/restless 0 3  Suicidal thoughts 0 1  PHQ-9 Score 2 25  Difficult doing work/chores Somewhat difficult Somewhat difficult        09/14/2023   10:29 AM 08/09/2023  3:39 PM  GAD 7 : Generalized Anxiety Score  Nervous, Anxious, on Edge 1 3  Control/stop worrying 0 3  Worry too much - different things 1 3  Trouble relaxing 1 3  Restless 0 3  Easily annoyed or irritable 1 3  Afraid - awful might happen 0 3  Total GAD 7 Score 4 21  Anxiety Difficulty Somewhat difficult Extremely difficult  Much improved on Zoloft 50 mg daily Continue current medication         Tobacco use   Smokes about 1 pack/day since age 64 .  Patient denies cough, shortness of breath, wheezing Asked about  quitting: confirms that he/she currently smokes cigarettes Advise to quit smoking: Educated about QUITTING to reduce the risk of cancer, cardio and cerebrovascular disease. Assess willingness: Unwilling to quit at this time, but is working on cutting back. Assist with counseling and pharmacotherapy: Counseled for 5 minutes and literature provided. Arrange for follow up: follow up in 1 month and continue to offer help.       Need for influenza vaccination   Patient educated on CDC recommendation for the vaccine. Verbal consent was obtained from the patient, vaccine administered by nurse, no sign of adverse reactions noted at this time. Patient education on arm soreness and use of tylenol or  for this patient  was discussed. Patient educated on the signs and symptoms of adverse effect and advise to contact the office if they occur.       Relevant Orders   Flu vaccine trivalent PF, 6mos and older(Flulaval,Afluria,Fluarix,Fluzone) (Completed)   Annual physical exam - Primary   Annual exam as documented.  Counseling done include healthy lifestyle involving committing to 150 minutes of exercise per week, heart healthy diet, and attaining healthy weight.  Changes in health habits are decided on by patient with goals and time frames set for achieving them. Immunization and cancer screening  needs are specifically addressed at this visit.    . Screening for endocrine, nutritional, metabolic and immunity disorder (Primary)  - CBC - CMP14+EGFR - Lipid panel - HIV Antibody (routine testing w rflx) - Hepatitis C antibody - POCT glycosylated hemoglobin (Hb A1C)  . Screening for colon cancer  - Cologuard  . Need for influenza vaccination  - Flu vaccine trivalent PF, 6mos and older(Flulaval,Afluria,Fluarix,Fluzone) Encouraged to get Tdap vaccine and pneumococcal vaccine at his pharmacy       Class 3 severe obesity with serious comorbidity and body mass index (BMI) of 45.0 to 49.9 in adult [facility])    Wt Readings from Last 3 Encounters:  09/14/23 (!) 306 lb (138.8 kg)  08/09/23 (!) 303 lb (137.4 kg)  08/14/18 (!) 360 lb (163.3 kg)   Body mass index is 45.19 kg/m.  Patient counseled on low-carb diet Encouraged to engage in regular moderate exercises at least 150 minutes weekly as tolerated Will consider starting patient on a GLP-1 once he is asymptomatic from his uncontrolled type 2 diabetes      Relevant Medications   insulin glargine (LANTUS SOLOSTAR) 100 UNIT/ML Solostar Pen   metFORMIN (GLUCOPHAGE-XR) 500 MG 24 hr tablet   Other Visit Diagnoses       Screening for endocrine, nutritional, metabolic and immunity disorder       Relevant Orders   CBC   CMP14+EGFR   Lipid panel   HIV Antibody (routine testing w rflx)   Hepatitis C antibody   POCT glycosylated hemoglobin (Hb A1C) (Completed)     Screening for colon cancer  Relevant Orders   Cologuard      Return in about 4 weeks (around 10/12/2023) for HTN, DM.     Donell Beers, FNP

## 2023-09-14 NOTE — Assessment & Plan Note (Signed)
 Annual exam as documented.  Counseling done include healthy lifestyle involving committing to 150 minutes of exercise per week, heart healthy diet, and attaining healthy weight.  Changes in health habits are decided on by patient with goals and time frames set for achieving them. Immunization and cancer screening  needs are specifically addressed at this visit.    . Screening for endocrine, nutritional, metabolic and immunity disorder (Primary)  - CBC - CMP14+EGFR - Lipid panel - HIV Antibody (routine testing w rflx) - Hepatitis C antibody - POCT glycosylated hemoglobin (Hb A1C)  . Screening for colon cancer  - Cologuard  . Need for influenza vaccination  - Flu vaccine trivalent PF, 6mos and older(Flulaval,Afluria,Fluarix,Fluzone) Encouraged to get Tdap vaccine and pneumococcal vaccine at his pharmacy

## 2023-09-14 NOTE — Assessment & Plan Note (Signed)
 Wt Readings from Last 3 Encounters:  09/14/23 (!) 306 lb (138.8 kg)  08/09/23 (!) 303 lb (137.4 kg)  08/14/18 (!) 360 lb (163.3 kg)   Body mass index is 45.19 kg/m.  Patient counseled on low-carb diet Encouraged to engage in regular moderate exercises at least 150 minutes weekly as tolerated Will consider starting patient on a GLP-1 once he is asymptomatic from his uncontrolled type 2 diabetes

## 2023-09-14 NOTE — Assessment & Plan Note (Addendum)
 Start lisinopril 10 mg daily DASH diet and commitment to daily physical activity for a minimum of 30 minutes discussed and encouraged, as a part of hypertension management. The importance of attaining a healthy weight is also discussed. Follow-up in 4 weeks     09/14/2023    9:43 AM 09/14/2023    9:21 AM 09/14/2023    9:16 AM 08/09/2023    3:43 PM 08/14/2018    5:09 PM 08/14/2018    4:45 PM 08/14/2018    4:30 PM  BP/Weight  Systolic BP 147 143 142 115 107 107 111  Diastolic BP 90 84 88 82 67 67 70  Wt. (Lbs)   306 303     BMI   45.19 kg/m2 44.75 kg/m2

## 2023-09-15 ENCOUNTER — Telehealth: Payer: Self-pay | Admitting: Nurse Practitioner

## 2023-09-15 ENCOUNTER — Other Ambulatory Visit: Payer: Self-pay

## 2023-09-15 ENCOUNTER — Telehealth: Payer: Self-pay

## 2023-09-15 LAB — MICROALBUMIN / CREATININE URINE RATIO
Creatinine, Urine: 171.4 mg/dL
Microalb/Creat Ratio: 50 mg/g{creat} — ABNORMAL HIGH (ref 0–29)
Microalbumin, Urine: 85.5 ug/mL

## 2023-09-15 NOTE — Telephone Encounter (Signed)
 Copied from CRM [id number]. Topic: Clinical - Prescription Issue >> Sep 15, 2023  3:03 PM Shelah Lewandowsky wrote: Reason for CRM: Patient needs prescription for needles for his insulin- they were not included in the prescriptions from yesterday- patient [phone]

## 2023-09-16 ENCOUNTER — Other Ambulatory Visit: Payer: Self-pay

## 2023-09-16 ENCOUNTER — Other Ambulatory Visit: Payer: Self-pay | Admitting: Nurse Practitioner

## 2023-09-16 ENCOUNTER — Telehealth: Payer: Self-pay

## 2023-09-16 DIAGNOSIS — E1365 Other specified diabetes mellitus with hyperglycemia: Secondary | ICD-10-CM

## 2023-09-16 DIAGNOSIS — E785 Hyperlipidemia, unspecified: Secondary | ICD-10-CM

## 2023-09-16 LAB — CMP14+EGFR
ALT: 15 IU/L (ref 0–44)
AST: 15 IU/L (ref 0–40)
Albumin: 4.3 g/dL (ref 4.1–5.1)
Alkaline Phosphatase: 91 IU/L (ref 44–121)
BUN/Creatinine Ratio: 12 (ref 9–20)
BUN: 10 mg/dL (ref 6–24)
Bilirubin Total: 0.6 mg/dL (ref 0.0–1.2)
CO2: 25 mmol/L (ref 20–29)
Calcium: 9.7 mg/dL (ref 8.7–10.2)
Chloride: 99 mmol/L (ref 96–106)
Creatinine, Ser: 0.83 mg/dL (ref 0.76–1.27)
Globulin, Total: 2.8 g/dL (ref 1.5–4.5)
Glucose: 170 mg/dL — ABNORMAL HIGH (ref 70–99)
Potassium: 4.2 mmol/L (ref 3.5–5.2)
Sodium: 142 mmol/L (ref 134–144)
Total Protein: 7.1 g/dL (ref 6.0–8.5)
eGFR: 108 mL/min/{1.73_m2} (ref >59)

## 2023-09-16 LAB — LIPID PANEL
Chol/HDL Ratio: 5.7 ratio — ABNORMAL HIGH (ref 0.0–5.0)
Cholesterol, Total: 232 mg/dL — ABNORMAL HIGH (ref 100–199)
HDL: 41 mg/dL (ref >39)
LDL Chol Calc (NIH): 151 mg/dL — ABNORMAL HIGH (ref 0–99)
Triglycerides: 221 mg/dL — ABNORMAL HIGH (ref 0–149)
VLDL Cholesterol Cal: 40 mg/dL (ref 5–40)

## 2023-09-16 LAB — CBC
Hematocrit: 48.8 % (ref 37.5–51.0)
Hemoglobin: 15.7 g/dL (ref 13.0–17.7)
MCH: 27.9 pg (ref 26.6–33.0)
MCHC: 32.2 g/dL (ref 31.5–35.7)
MCV: 87 fL (ref 79–97)
Platelets: 226 10*3/uL (ref 150–450)
RBC: 5.62 x10E6/uL (ref 4.14–5.80)
RDW: 13.5 % (ref 11.6–15.4)
WBC: 7.7 10*3/uL (ref 3.4–10.8)

## 2023-09-16 LAB — HEPATITIS C ANTIBODY: Hep C Virus Ab: NONREACTIVE

## 2023-09-16 LAB — HIV ANTIBODY (ROUTINE TESTING W REFLEX)

## 2023-09-16 MED ORDER — ATORVASTATIN CALCIUM 40 MG PO TABS: 40.0000 mg | ORAL_TABLET | Freq: Every day | ORAL | 1 refills | Status: AC

## 2023-09-16 MED ORDER — INSULIN PEN NEEDLE 30G X 8 MM MISC: 1.0000 | Status: AC | PRN

## 2023-09-16 NOTE — Telephone Encounter (Signed)
 Copied from CRM [id number]. Topic: Clinical - Lab/Test Results >> Sep 16, 2023 12:39 PM Tiffany B wrote: Reason for CRM: Patient returning Nurse Keshaun Dubey call and stated he had no further questions regarding his lab results he just wanted to know if they would send in his needles. >> Sep 16, 2023 12:58 PM Tiffany B wrote: Caller did state he wills schedule a follow up through My Chart.

## 2023-09-19 ENCOUNTER — Ambulatory Visit: Payer: Medicaid Other | Admitting: Adult Health

## 2023-09-19 ENCOUNTER — Other Ambulatory Visit: Payer: Self-pay

## 2023-09-29 ENCOUNTER — Other Ambulatory Visit: Payer: Self-pay | Admitting: *Deleted

## 2023-09-29 DIAGNOSIS — I890 Lymphedema, not elsewhere classified: Secondary | ICD-10-CM

## 2023-10-07 ENCOUNTER — Encounter (HOSPITAL_COMMUNITY): Payer: Self-pay

## 2023-10-07 ENCOUNTER — Encounter: Payer: Medicaid Other | Admitting: Vascular Surgery

## 2023-10-07 ENCOUNTER — Ambulatory Visit (HOSPITAL_COMMUNITY): Payer: Medicaid Other

## 2023-11-01 ENCOUNTER — Ambulatory Visit: Admitting: Nurse Practitioner

## 2023-11-09 ENCOUNTER — Telehealth: Payer: Self-pay | Admitting: *Deleted

## 2023-11-09 NOTE — Telephone Encounter (Signed)
 ATC patient x1.  LVM for patient to bring CPAP machine if he has one to his appointment tomorrow.

## 2023-11-10 ENCOUNTER — Ambulatory Visit: Admitting: Adult Health

## 2023-12-07 ENCOUNTER — Ambulatory Visit: Admitting: Nurse Practitioner

## 2024-01-10 ENCOUNTER — Other Ambulatory Visit: Payer: Self-pay | Admitting: Nurse Practitioner

## 2024-01-10 DIAGNOSIS — E1365 Other specified diabetes mellitus with hyperglycemia: Secondary | ICD-10-CM

## 2024-01-10 DIAGNOSIS — F419 Anxiety disorder, unspecified: Secondary | ICD-10-CM

## 2024-01-18 ENCOUNTER — Ambulatory Visit: Admitting: Nurse Practitioner

## 2024-02-29 ENCOUNTER — Ambulatory Visit (INDEPENDENT_AMBULATORY_CARE_PROVIDER_SITE_OTHER): Admitting: Nurse Practitioner

## 2024-02-29 DIAGNOSIS — E1365 Other specified diabetes mellitus with hyperglycemia: Secondary | ICD-10-CM

## 2024-02-29 DIAGNOSIS — I10 Essential (primary) hypertension: Secondary | ICD-10-CM

## 2024-02-29 DIAGNOSIS — E785 Hyperlipidemia, unspecified: Secondary | ICD-10-CM

## 2024-03-01 ENCOUNTER — Other Ambulatory Visit: Payer: Self-pay

## 2024-03-02 NOTE — Progress Notes (Signed)
 No show

## 2024-03-28 ENCOUNTER — Ambulatory Visit: Admitting: Nurse Practitioner

## 2024-05-29 ENCOUNTER — Ambulatory Visit: Admitting: Nurse Practitioner

## 2024-06-06 ENCOUNTER — Other Ambulatory Visit: Payer: Self-pay

## 2024-06-08 ENCOUNTER — Ambulatory Visit: Payer: Self-pay | Admitting: Nurse Practitioner

## 2024-06-26 ENCOUNTER — Ambulatory Visit: Admitting: Nurse Practitioner
